# Patient Record
Sex: Male | Born: 1945 | Race: White | Hispanic: No | Marital: Married | Smoking: Never smoker
Health system: Southern US, Community
[De-identification: ages and names within clinical notes are randomized; demographics above are authoritative.]

## PROBLEM LIST (undated history)

## (undated) DIAGNOSIS — R251 Tremor, unspecified: Secondary | ICD-10-CM

## (undated) DIAGNOSIS — K219 Gastro-esophageal reflux disease without esophagitis: Secondary | ICD-10-CM

## (undated) DIAGNOSIS — M1990 Unspecified osteoarthritis, unspecified site: Secondary | ICD-10-CM

## (undated) DIAGNOSIS — G4730 Sleep apnea, unspecified: Secondary | ICD-10-CM

## (undated) DIAGNOSIS — R351 Nocturia: Secondary | ICD-10-CM

## (undated) DIAGNOSIS — M19011 Primary osteoarthritis, right shoulder: Secondary | ICD-10-CM

## (undated) DIAGNOSIS — Z87442 Personal history of urinary calculi: Secondary | ICD-10-CM

## (undated) HISTORY — PX: JOINT REPLACEMENT: SHX530

## (undated) HISTORY — PX: APPENDECTOMY: SHX54

## (undated) HISTORY — PX: TONSILLECTOMY: SUR1361

---

## 2010-04-10 ENCOUNTER — Encounter
Admission: RE | Admit: 2010-04-10 | Discharge: 2010-04-10 | Payer: Self-pay | Source: Home / Self Care | Attending: Neurosurgery | Admitting: Neurosurgery

## 2010-08-19 ENCOUNTER — Encounter (HOSPITAL_COMMUNITY)
Admission: RE | Admit: 2010-08-19 | Discharge: 2010-08-19 | Disposition: A | Discharge status: Home / Self Care | Payer: Medicare Other | Source: Ambulatory Visit | Attending: Orthopedic Surgery | Admitting: Orthopedic Surgery

## 2010-08-19 LAB — CBC
MCH: 32 pg (ref 26.0–34.0)
MCHC: 34.8 g/dL (ref 30.0–36.0)
Platelets: 183 10*3/uL (ref 150–400)

## 2010-08-19 LAB — URINALYSIS, ROUTINE W REFLEX MICROSCOPIC
Bilirubin Urine: NEGATIVE
Glucose, UA: NEGATIVE mg/dL
Hgb urine dipstick: NEGATIVE
Ketones, ur: NEGATIVE mg/dL
Protein, ur: NEGATIVE mg/dL

## 2010-08-19 LAB — DIFFERENTIAL
Basophils Relative: 0 % (ref 0–1)
Eosinophils Absolute: 0.2 10*3/uL (ref 0.0–0.7)
Monocytes Absolute: 0.8 10*3/uL (ref 0.1–1.0)
Monocytes Relative: 9 % (ref 3–12)
Neutrophils Relative %: 64 % (ref 43–77)

## 2010-08-19 LAB — SURGICAL PCR SCREEN: MRSA, PCR: NEGATIVE

## 2010-08-19 LAB — COMPREHENSIVE METABOLIC PANEL
AST: 73 U/L — ABNORMAL HIGH (ref 0–37)
BUN: 16 mg/dL (ref 6–23)
CO2: 32 mEq/L (ref 19–32)
Chloride: 99 mEq/L (ref 96–112)
Creatinine, Ser: 0.81 mg/dL (ref 0.4–1.5)
GFR calc non Af Amer: >60 mL/min (ref >60)
Total Bilirubin: 0.5 mg/dL (ref 0.3–1.2)

## 2010-08-19 LAB — PROTIME-INR: Prothrombin Time: 12.7 seconds (ref 11.6–15.2)

## 2010-08-20 LAB — URINE CULTURE: Culture: NO GROWTH

## 2010-08-27 ENCOUNTER — Inpatient Hospital Stay (HOSPITAL_COMMUNITY): Payer: Medicare Other

## 2010-08-27 ENCOUNTER — Inpatient Hospital Stay (HOSPITAL_COMMUNITY)
Admission: RE | Admit: 2010-08-27 | Discharge: 2010-08-29 | DRG: 470 | Disposition: A | Discharge status: Home Health | Payer: Medicare Other | Source: Ambulatory Visit | Attending: Orthopedic Surgery | Admitting: Orthopedic Surgery

## 2010-08-27 DIAGNOSIS — E669 Obesity, unspecified: Secondary | ICD-10-CM | POA: Diagnosis present

## 2010-08-27 DIAGNOSIS — Z7901 Long term (current) use of anticoagulants: Secondary | ICD-10-CM

## 2010-08-27 DIAGNOSIS — I10 Essential (primary) hypertension: Secondary | ICD-10-CM | POA: Diagnosis present

## 2010-08-27 DIAGNOSIS — K5900 Constipation, unspecified: Secondary | ICD-10-CM | POA: Diagnosis not present

## 2010-08-27 DIAGNOSIS — E7800 Pure hypercholesterolemia, unspecified: Secondary | ICD-10-CM | POA: Diagnosis present

## 2010-08-27 DIAGNOSIS — K219 Gastro-esophageal reflux disease without esophagitis: Secondary | ICD-10-CM | POA: Diagnosis present

## 2010-08-27 DIAGNOSIS — G4733 Obstructive sleep apnea (adult) (pediatric): Secondary | ICD-10-CM | POA: Diagnosis present

## 2010-08-27 DIAGNOSIS — E871 Hypo-osmolality and hyponatremia: Secondary | ICD-10-CM | POA: Diagnosis not present

## 2010-08-27 DIAGNOSIS — IMO0002 Reserved for concepts with insufficient information to code with codable children: Secondary | ICD-10-CM

## 2010-08-27 DIAGNOSIS — M169 Osteoarthritis of hip, unspecified: Principal | ICD-10-CM | POA: Diagnosis present

## 2010-08-27 DIAGNOSIS — M1610 Unilateral primary osteoarthritis, unspecified hip: Principal | ICD-10-CM | POA: Diagnosis present

## 2010-08-27 DIAGNOSIS — Z01812 Encounter for preprocedural laboratory examination: Secondary | ICD-10-CM

## 2010-08-27 DIAGNOSIS — D62 Acute posthemorrhagic anemia: Secondary | ICD-10-CM | POA: Diagnosis not present

## 2010-08-27 LAB — TYPE AND SCREEN: Antibody Screen: NEGATIVE

## 2010-08-28 LAB — CBC
MCH: 31.3 pg (ref 26.0–34.0)
Platelets: 126 10*3/uL — ABNORMAL LOW (ref 150–400)
RBC: 3.68 MIL/uL — ABNORMAL LOW (ref 4.22–5.81)
RDW: 12.4 % (ref 11.5–15.5)

## 2010-08-28 LAB — BASIC METABOLIC PANEL
Calcium: 7.9 mg/dL — ABNORMAL LOW (ref 8.4–10.5)
Chloride: 96 mEq/L (ref 96–112)
Creatinine, Ser: 0.84 mg/dL (ref 0.4–1.5)
GFR calc Af Amer: >60 mL/min (ref >60)
GFR calc non Af Amer: >60 mL/min (ref >60)

## 2010-08-29 LAB — BASIC METABOLIC PANEL
Chloride: 97 mEq/L (ref 96–112)
GFR calc Af Amer: >60 mL/min (ref >60)
GFR calc non Af Amer: >60 mL/min (ref >60)
Potassium: 4.1 mEq/L (ref 3.5–5.1)
Sodium: 132 mEq/L — ABNORMAL LOW (ref 135–145)

## 2010-08-29 LAB — CBC
Platelets: 141 10*3/uL — ABNORMAL LOW (ref 150–400)
RBC: 3.54 MIL/uL — ABNORMAL LOW (ref 4.22–5.81)
WBC: 10.6 10*3/uL — ABNORMAL HIGH (ref 4.0–10.5)

## 2010-09-15 NOTE — Op Note (Signed)
NAMESHADD, Juan Spencer               ACCOUNT NO.:  [id number]  MEDICAL RECORD NO.:  [id number]  LOCATION:  5030                         FACILITY:  [facility]  PHYSICIAN:  Molly Maduro A. Thurston Hole, M.D. DATE OF BIRTH:  October 07, 1945  DATE OF PROCEDURE:  08/27/2010 DATE OF DISCHARGE:                              OPERATIVE REPORT   PREOPERATIVE DIAGNOSIS:  Left hip degenerative joint disease.  POSTOPERATIVE DIAGNOSIS:  Left hip degenerative joint disease.  PROCEDURE:  Left total hip replacement using DePuy Press-Fit total hip system with acetabulum 58-mm Press-Fit pinnacle acetabulum with one locking screw and 10-degree polyethylene liner.  Femoral component #6 Summit Press-Fit femoral stem with +1 x 36 mm hip ball.  SURGEON:  Elana Alm. Thurston Hole, MD  ASSISTANT:  Julien Girt, PA-C  ANESTHESIA:  General.  OPERATIVE TIME:  1 hour 20 minutes.  ESTIMATED BLOOD LOSS:  250 mL.  COMPLICATIONS:  None.  DESCRIPTION OF PROCEDURE:  Juan Spencer was brought to the operating room on August 27, 2010, and placed on operative table in supine position. After being placed under general anesthesia, he received Ancef 2 g IV preoperatively for prophylaxis.  He had a Foley catheter placed under sterile conditions.  His left hip was examined under anesthesia.  He had flexion at 90, extension to 0, internal and external rotation of 20 degrees with leg lengths approximately equal.  He was then placed in the left lateral decubitus position and secured on the bed with a Stulberg frame.  His left hip and leg was prepped using sterile DuraPrep and draped using sterile technique.  Time-out procedure was called and the correct left hip identified.  Initially, through a 15-cm posterolateral greater trochanteric incision, an initial exposure was made.  The underlying subcutaneous tissues were incised along with skin incision. Iliotibial band and gluteus maximus fascia was incised longitudinally revealing the underlying  sciatic nerve which was carefully protected. The short external rotators of the hip and hip capsule were released off the femoral neck insertion intact and then the hip was then posteriorly dislocated.  He was found to have grade 3 and 4 DJD noted on the femoral head.  A femoral neck cut was then made 1.5 to 2 cm above the lesser trochanter in the appropriate manner of anteversion and inclination. Acetabulum was then exposed.  He was found to have grade 3 and 4 DJD in the acetabulum.  Degenerative acetabular labrum was removed.  Carefully placed retractors were then placed around the acetabulum and then sequential acetabular reaming was carried out in the appropriate manner of anteversion, abduction and inclination up to a 57-mm size and then a 58-mm acetabular trial was hammered into position with an excellent fit, it was then removed and the actual 58-mm pinnacle cup was hammered into position in the appropriate manner of anteversion, abduction and inclination with an excellent fit.  It was further secured in place with 120-degree locking screw in the 12 o'clock position.  A 10-degree polyethylene liner was then placed with a 10-degree lip in the posterolateral position.  At this point, the proximal femur was exposed. Sequential femoral canal reaming was carried out to a #6 size and broaching to a #6 size with  a #6 broach in place and a +1 x 36 mm hip ball.  The hip was reduced, taken through a full range of motion, found to be stable up to 80 degrees of internal rotation in both neutral and 30 degrees of adduction and also stable in abduction and external rotation.  Leg lengths were also found to be equal.  At this point, it was felt that the femoral trial was excellent size and fit.  It was then removed.  The femoral canal was irrigated and the actual #6 Press-Fit Summit stem was hammered into position with an excellent fit and the +1 x 36 mm hip ball was placed on the femoral neck and  tapped into position with an excellent Morse taper fit.  The hip was then reduced, again found to be stable throughout a full range of motion and leg lengths equal.  At this point, it was felt that all the components were of excellent size, fit and stability.  The wound was further irrigated with saline and then the short external rotators of the hip and hip capsule were reattached to their femoral neck insertion through two drill holes in the greater trochanter.  The iliotibial band and gluteus maximus fascia was closed with #1 Ethibond suture over two medium Hemovac drains.  Subcutaneous tissues were closed with 0 and 2-0 Vicryl, subcuticular layer closed with 4-0 Monocryl.  Sterile dressings were applied and an abduction pillow.  The patient turned supine, checked for leg lengths that were equal, rotation equal, pulses 2+ and symmetric. He was then awakened, extubated and taken to recovery room in stable condition.  Needle, sponge counts correct x2 at the end of the case.     Keri Tavella A. Thurston Hole, M.D.     RAW/MEDQ  D:  08/27/2010  T:  08/28/2010  Job:  [id number]  Electronically Signed by Salvatore Marvel M.D. on 09/15/2010 01:43:31 PM

## 2010-11-12 ENCOUNTER — Other Ambulatory Visit: Payer: Self-pay | Admitting: Neurosurgery

## 2010-11-12 DIAGNOSIS — M542 Cervicalgia: Secondary | ICD-10-CM

## 2010-11-12 DIAGNOSIS — M47812 Spondylosis without myelopathy or radiculopathy, cervical region: Secondary | ICD-10-CM

## 2010-11-15 ENCOUNTER — Ambulatory Visit
Admission: RE | Admit: 2010-11-15 | Discharge: 2010-11-15 | Disposition: A | Discharge status: Home / Self Care | Payer: Medicare Other | Source: Ambulatory Visit | Attending: Neurosurgery | Admitting: Neurosurgery

## 2010-11-15 DIAGNOSIS — M542 Cervicalgia: Secondary | ICD-10-CM

## 2010-11-15 DIAGNOSIS — M47812 Spondylosis without myelopathy or radiculopathy, cervical region: Secondary | ICD-10-CM

## 2012-11-17 ENCOUNTER — Other Ambulatory Visit (HOSPITAL_COMMUNITY): Payer: Self-pay | Admitting: Orthopedic Surgery

## 2012-11-17 DIAGNOSIS — M8700 Idiopathic aseptic necrosis of unspecified bone: Secondary | ICD-10-CM

## 2012-11-17 DIAGNOSIS — M25551 Pain in right hip: Secondary | ICD-10-CM

## 2012-11-22 ENCOUNTER — Encounter (HOSPITAL_COMMUNITY): Payer: Self-pay

## 2012-11-22 ENCOUNTER — Ambulatory Visit (HOSPITAL_COMMUNITY)
Admission: RE | Admit: 2012-11-22 | Discharge: 2012-11-22 | Disposition: A | Discharge status: Home / Self Care | Payer: Medicare Other | Source: Ambulatory Visit | Attending: Orthopedic Surgery | Admitting: Orthopedic Surgery

## 2012-11-22 DIAGNOSIS — M169 Osteoarthritis of hip, unspecified: Secondary | ICD-10-CM | POA: Insufficient documentation

## 2012-11-22 DIAGNOSIS — M25551 Pain in right hip: Secondary | ICD-10-CM

## 2012-11-22 DIAGNOSIS — M8700 Idiopathic aseptic necrosis of unspecified bone: Secondary | ICD-10-CM

## 2012-11-22 DIAGNOSIS — M25559 Pain in unspecified hip: Secondary | ICD-10-CM | POA: Insufficient documentation

## 2012-11-22 DIAGNOSIS — M1610 Unilateral primary osteoarthritis, unspecified hip: Secondary | ICD-10-CM | POA: Insufficient documentation

## 2013-10-10 ENCOUNTER — Ambulatory Visit
Admission: RE | Admit: 2013-10-10 | Discharge: 2013-10-10 | Disposition: A | Discharge status: Home / Self Care | Payer: Medicare Other | Source: Ambulatory Visit | Attending: Orthopedic Surgery | Admitting: Orthopedic Surgery

## 2013-10-10 ENCOUNTER — Other Ambulatory Visit: Payer: Medicare Other

## 2013-10-10 ENCOUNTER — Other Ambulatory Visit: Payer: Self-pay | Admitting: Orthopedic Surgery

## 2013-10-10 DIAGNOSIS — M19019 Primary osteoarthritis, unspecified shoulder: Secondary | ICD-10-CM

## 2013-11-16 ENCOUNTER — Other Ambulatory Visit: Payer: Self-pay | Admitting: Orthopedic Surgery

## 2013-11-29 ENCOUNTER — Encounter (HOSPITAL_COMMUNITY): Payer: Self-pay | Admitting: Pharmacy Technician

## 2013-11-30 ENCOUNTER — Encounter (HOSPITAL_COMMUNITY): Payer: Self-pay

## 2013-11-30 ENCOUNTER — Encounter (HOSPITAL_COMMUNITY)
Admission: RE | Admit: 2013-11-30 | Discharge: 2013-11-30 | Disposition: A | Discharge status: Home / Self Care | Payer: Medicare Other | Source: Ambulatory Visit | Attending: Orthopedic Surgery | Admitting: Orthopedic Surgery

## 2013-11-30 DIAGNOSIS — M19019 Primary osteoarthritis, unspecified shoulder: Secondary | ICD-10-CM | POA: Diagnosis present

## 2013-11-30 DIAGNOSIS — Z01812 Encounter for preprocedural laboratory examination: Secondary | ICD-10-CM | POA: Insufficient documentation

## 2013-11-30 HISTORY — DX: Tremor, unspecified: R25.1

## 2013-11-30 HISTORY — DX: Sleep apnea, unspecified: G47.30

## 2013-11-30 HISTORY — DX: Gastro-esophageal reflux disease without esophagitis: K21.9

## 2013-11-30 HISTORY — DX: Unspecified osteoarthritis, unspecified site: M19.90

## 2013-11-30 HISTORY — DX: Nocturia: R35.1

## 2013-11-30 LAB — BASIC METABOLIC PANEL WITH GFR
Anion gap: 14 (ref 5–15)
BUN: 21 mg/dL (ref 6–23)
CO2: 23 meq/L (ref 19–32)
Calcium: 9.2 mg/dL (ref 8.4–10.5)
Chloride: 104 meq/L (ref 96–112)
Creatinine, Ser: 0.79 mg/dL (ref 0.50–1.35)
GFR calc Af Amer: >90 mL/min
GFR calc non Af Amer: >90 mL/min
Glucose, Bld: 101 mg/dL — ABNORMAL HIGH (ref 70–99)
Potassium: 5.3 meq/L (ref 3.7–5.3)
Sodium: 141 meq/L (ref 137–147)

## 2013-11-30 LAB — CBC
HCT: 43 % (ref 39.0–52.0)
Hemoglobin: 14.7 g/dL (ref 13.0–17.0)
MCH: 32.2 pg (ref 26.0–34.0)
MCHC: 34.2 g/dL (ref 30.0–36.0)
MCV: 94.1 fL (ref 78.0–100.0)
Platelets: 110 K/uL — ABNORMAL LOW (ref 150–400)
RBC: 4.57 MIL/uL (ref 4.22–5.81)
RDW: 13.1 % (ref 11.5–15.5)
WBC: 5.8 K/uL (ref 4.0–10.5)

## 2013-11-30 LAB — TYPE AND SCREEN
ABO/RH(D): B POS
Antibody Screen: NEGATIVE

## 2013-11-30 NOTE — Pre-Procedure Instructions (Signed)
Juan Spencer  11/30/2013   Your procedure is scheduled on:  12-12-13    Tuesday   Report to [facility] Admitting at 5:30  AM.   Call this number if you have problems the morning of surgery: [phone]   Remember:   Do not eat food or drink liquids after midnight.    Take these medicines the morning of surgery with A SIP OF WATER: pain medication if needed,omeprazole(prilosec),primidone(Mysoline),propranolol(Inderal)   Do not wear jewelry  Do not wear lotions, powders, or perfumes. You may not wear deodorant.   Do not shave 48 hours prior to surgery. Men may shave face and neck.  Do not bring valuables to the hospital.  [facility] is not responsible for any belongings or valuables.               Contacts, dentures or bridgework may not be worn into surgery .  Leave suitcase in the car. After surgery it may be brought to your room.   For patients admitted to the hospital, discharge time is determined by your treatment team.               Patients discharged the day of surgery will not be allowed to drive home.      Special Instructions: See attached Sheet for instructions on CHG shower/bath     Please read over the following fact sheets that you were given: Pain Booklet, Coughing and Deep Breathing, Blood Transfusion Information and Surgical Site Infection Prevention

## 2013-12-04 NOTE — Progress Notes (Signed)
Attempted to call multi times and office message says to call back, that all lines are busy.

## 2013-12-04 NOTE — Progress Notes (Signed)
Spoke with the office staff, they did receive the request for last office visit and EKG, and will fax over later.

## 2013-12-07 NOTE — Progress Notes (Signed)
Spoke with office staff related to need for EKG , states that they will fax it right over.

## 2013-12-11 MED ORDER — CEFAZOLIN SODIUM-DEXTROSE 2-3 GM-% IV SOLR
2.0000 g | INTRAVENOUS | Status: AC
  Administered 2013-12-12: 2 g via INTRAVENOUS
  Filled 2013-12-11: qty 50

## 2013-12-12 ENCOUNTER — Encounter (HOSPITAL_COMMUNITY): Payer: Medicare Other | Admitting: Anesthesiology

## 2013-12-12 ENCOUNTER — Inpatient Hospital Stay (HOSPITAL_COMMUNITY)
Admission: RE | Admit: 2013-12-12 | Discharge: 2013-12-13 | DRG: 483 | Disposition: A | Discharge status: Home Health | Payer: Medicare Other | Source: Ambulatory Visit | Attending: Orthopedic Surgery | Admitting: Orthopedic Surgery

## 2013-12-12 ENCOUNTER — Encounter (HOSPITAL_COMMUNITY)
Admission: RE | Disposition: A | Discharge status: Home Health | Payer: Self-pay | Source: Ambulatory Visit | Attending: Orthopedic Surgery

## 2013-12-12 ENCOUNTER — Encounter (HOSPITAL_COMMUNITY): Payer: Self-pay

## 2013-12-12 ENCOUNTER — Inpatient Hospital Stay (HOSPITAL_COMMUNITY): Payer: Medicare Other | Admitting: Anesthesiology

## 2013-12-12 ENCOUNTER — Inpatient Hospital Stay (HOSPITAL_COMMUNITY): Payer: Medicare Other

## 2013-12-12 DIAGNOSIS — M19019 Primary osteoarthritis, unspecified shoulder: Principal | ICD-10-CM | POA: Diagnosis present

## 2013-12-12 DIAGNOSIS — K219 Gastro-esophageal reflux disease without esophagitis: Secondary | ICD-10-CM | POA: Diagnosis present

## 2013-12-12 DIAGNOSIS — G4730 Sleep apnea, unspecified: Secondary | ICD-10-CM | POA: Diagnosis present

## 2013-12-12 DIAGNOSIS — IMO0002 Reserved for concepts with insufficient information to code with codable children: Secondary | ICD-10-CM

## 2013-12-12 DIAGNOSIS — Z886 Allergy status to analgesic agent status: Secondary | ICD-10-CM

## 2013-12-12 DIAGNOSIS — G709 Myoneural disorder, unspecified: Secondary | ICD-10-CM | POA: Diagnosis present

## 2013-12-12 DIAGNOSIS — M19011 Primary osteoarthritis, right shoulder: Secondary | ICD-10-CM

## 2013-12-12 DIAGNOSIS — Z79899 Other long term (current) drug therapy: Secondary | ICD-10-CM | POA: Diagnosis not present

## 2013-12-12 HISTORY — PX: TOTAL SHOULDER ARTHROPLASTY: SHX126

## 2013-12-12 HISTORY — DX: Primary osteoarthritis, right shoulder: M19.011

## 2013-12-12 SURGERY — ARTHROPLASTY, SHOULDER, TOTAL
Anesthesia: General | Site: Shoulder | Laterality: Right

## 2013-12-12 MED ORDER — PANTOPRAZOLE SODIUM 40 MG PO TBEC
40.0000 mg | DELAYED_RELEASE_TABLET | Freq: Every day | ORAL | Status: DC
  Administered 2013-12-13: 40 mg via ORAL
  Filled 2013-12-12: qty 1

## 2013-12-12 MED ORDER — HYDROMORPHONE HCL 1 MG/ML IJ SOLN: 0.5000 mg | INTRAMUSCULAR | Status: DC | PRN

## 2013-12-12 MED ORDER — PROPOFOL 10 MG/ML IV BOLUS
INTRAVENOUS | Status: DC | PRN
  Administered 2013-12-12: 150 mg via INTRAVENOUS

## 2013-12-12 MED ORDER — BUPIVACAINE HCL (PF) 0.25 % IJ SOLN
INTRAMUSCULAR | Status: DC | PRN
  Administered 2013-12-12: 10 mL

## 2013-12-12 MED ORDER — ONDANSETRON HCL 4 MG/2ML IJ SOLN
INTRAMUSCULAR | Status: AC
  Filled 2013-12-12: qty 2

## 2013-12-12 MED ORDER — METOCLOPRAMIDE HCL 5 MG PO TABS
5.0000 mg | ORAL_TABLET | Freq: Three times a day (TID) | ORAL | Status: DC | PRN
  Filled 2013-12-12: qty 2

## 2013-12-12 MED ORDER — MENTHOL 3 MG MT LOZG: 1.0000 | LOZENGE | OROMUCOSAL | Status: DC | PRN

## 2013-12-12 MED ORDER — ACETAMINOPHEN 325 MG PO TABS: 650.0000 mg | ORAL_TABLET | Freq: Four times a day (QID) | ORAL | Status: DC | PRN

## 2013-12-12 MED ORDER — ARTIFICIAL TEARS OP OINT
TOPICAL_OINTMENT | OPHTHALMIC | Status: DC | PRN
  Administered 2013-12-12: 1 via OPHTHALMIC

## 2013-12-12 MED ORDER — FENTANYL CITRATE 0.05 MG/ML IJ SOLN
25.0000 ug | INTRAMUSCULAR | Status: DC | PRN
  Administered 2013-12-12: 50 ug via INTRAVENOUS

## 2013-12-12 MED ORDER — GLYCOPYRROLATE 0.2 MG/ML IJ SOLN
INTRAMUSCULAR | Status: DC | PRN
  Administered 2013-12-12: 0.3 mg via INTRAVENOUS
  Administered 2013-12-12: 0.4 mg via INTRAVENOUS
  Administered 2013-12-12: 0.2 mg via INTRAVENOUS

## 2013-12-12 MED ORDER — ONDANSETRON HCL 4 MG/2ML IJ SOLN
4.0000 mg | Freq: Four times a day (QID) | INTRAMUSCULAR | Status: DC | PRN
Start: 2013-12-12 — End: 2013-12-13

## 2013-12-12 MED ORDER — ARTIFICIAL TEARS OP OINT
TOPICAL_OINTMENT | OPHTHALMIC | Status: AC
Start: 2013-12-12 — End: 2013-12-12
  Filled 2013-12-12: qty 3.5

## 2013-12-12 MED ORDER — SENNA 8.6 MG PO TABS
1.0000 | ORAL_TABLET | Freq: Two times a day (BID) | ORAL | Status: DC
  Administered 2013-12-12 – 2013-12-13 (×3): 8.6 mg via ORAL
  Filled 2013-12-12 (×4): qty 1

## 2013-12-12 MED ORDER — NEOSTIGMINE METHYLSULFATE 10 MG/10ML IV SOLN
INTRAVENOUS | Status: DC | PRN
  Administered 2013-12-12: 2 mg via INTRAVENOUS

## 2013-12-12 MED ORDER — ONDANSETRON HCL 4 MG PO TABS: 4.0000 mg | ORAL_TABLET | Freq: Four times a day (QID) | ORAL | Status: DC | PRN

## 2013-12-12 MED ORDER — EPHEDRINE SULFATE 50 MG/ML IJ SOLN
INTRAMUSCULAR | Status: DC | PRN
  Administered 2013-12-12: 15 mg via INTRAVENOUS

## 2013-12-12 MED ORDER — ACETAMINOPHEN 650 MG RE SUPP: 650.0000 mg | Freq: Four times a day (QID) | RECTAL | Status: DC | PRN

## 2013-12-12 MED ORDER — PRIMIDONE 50 MG PO TABS
50.0000 mg | ORAL_TABLET | Freq: Two times a day (BID) | ORAL | Status: DC
  Administered 2013-12-12 – 2013-12-13 (×2): 50 mg via ORAL
  Filled 2013-12-12 (×4): qty 1

## 2013-12-12 MED ORDER — SODIUM CHLORIDE 0.9 % IR SOLN
Status: DC | PRN
  Administered 2013-12-12: 3000 mL

## 2013-12-12 MED ORDER — POLYETHYLENE GLYCOL 3350 17 G PO PACK: 17.0000 g | PACK | Freq: Every day | ORAL | Status: DC | PRN

## 2013-12-12 MED ORDER — MAGNESIUM CITRATE PO SOLN: 1.0000 | Freq: Once | ORAL | Status: AC | PRN

## 2013-12-12 MED ORDER — OXYCODONE HCL 5 MG PO TABS
5.0000 mg | ORAL_TABLET | ORAL | Status: DC | PRN
  Administered 2013-12-12: 5 mg via ORAL
  Administered 2013-12-12: 10 mg via ORAL
  Administered 2013-12-12: 5 mg via ORAL
  Administered 2013-12-13 (×4): 10 mg via ORAL
  Filled 2013-12-12 (×4): qty 2
  Filled 2013-12-12: qty 1
  Filled 2013-12-12: qty 2
  Filled 2013-12-12: qty 1

## 2013-12-12 MED ORDER — CEFAZOLIN SODIUM-DEXTROSE 2-3 GM-% IV SOLR
2.0000 g | Freq: Four times a day (QID) | INTRAVENOUS | Status: AC
  Administered 2013-12-12 – 2013-12-13 (×3): 2 g via INTRAVENOUS
  Filled 2013-12-12 (×3): qty 50

## 2013-12-12 MED ORDER — PROPOFOL 10 MG/ML IV BOLUS
INTRAVENOUS | Status: AC
  Filled 2013-12-12: qty 20

## 2013-12-12 MED ORDER — POTASSIUM CHLORIDE IN NACL 20-0.45 MEQ/L-% IV SOLN
INTRAVENOUS | Status: DC
  Administered 2013-12-12: 20:00:00 via INTRAVENOUS
  Filled 2013-12-12 (×3): qty 1000

## 2013-12-12 MED ORDER — PREDNISONE 5 MG PO TABS
5.0000 mg | ORAL_TABLET | Freq: Every day | ORAL | Status: DC
Start: 2013-12-13 — End: 2013-12-13
  Administered 2013-12-13: 5 mg via ORAL
  Filled 2013-12-12 (×2): qty 1

## 2013-12-12 MED ORDER — LIDOCAINE HCL (CARDIAC) 20 MG/ML IV SOLN
INTRAVENOUS | Status: DC | PRN
  Administered 2013-12-12: 70 mg via INTRAVENOUS

## 2013-12-12 MED ORDER — MIDAZOLAM HCL 2 MG/2ML IJ SOLN
INTRAMUSCULAR | Status: AC
  Filled 2013-12-12: qty 2

## 2013-12-12 MED ORDER — DIPHENHYDRAMINE HCL 12.5 MG/5ML PO ELIX: 12.5000 mg | ORAL_SOLUTION | ORAL | Status: DC | PRN

## 2013-12-12 MED ORDER — OXYCODONE-ACETAMINOPHEN 5-325 MG PO TABS
1.0000 | ORAL_TABLET | ORAL | Status: DC | PRN
  Administered 2013-12-12 – 2013-12-13 (×4): 2 via ORAL
  Filled 2013-12-12 (×4): qty 2

## 2013-12-12 MED ORDER — METHOCARBAMOL 500 MG PO TABS
500.0000 mg | ORAL_TABLET | Freq: Four times a day (QID) | ORAL | Status: DC | PRN
  Administered 2013-12-12 – 2013-12-13 (×5): 500 mg via ORAL
  Filled 2013-12-12 (×4): qty 1

## 2013-12-12 MED ORDER — FENTANYL CITRATE 0.05 MG/ML IJ SOLN
INTRAMUSCULAR | Status: DC | PRN
  Administered 2013-12-12 (×2): 50 ug via INTRAVENOUS

## 2013-12-12 MED ORDER — PROPRANOLOL HCL ER 120 MG PO CP24
120.0000 mg | ORAL_CAPSULE | Freq: Every day | ORAL | Status: DC
  Administered 2013-12-13: 120 mg via ORAL
  Filled 2013-12-12 (×2): qty 1

## 2013-12-12 MED ORDER — TRAZODONE HCL 50 MG PO TABS
50.0000 mg | ORAL_TABLET | Freq: Every day | ORAL | Status: DC
  Administered 2013-12-12: 50 mg via ORAL
  Filled 2013-12-12 (×2): qty 1

## 2013-12-12 MED ORDER — ONDANSETRON HCL 4 MG/2ML IJ SOLN
INTRAMUSCULAR | Status: DC | PRN
  Administered 2013-12-12: 4 mg via INTRAVENOUS

## 2013-12-12 MED ORDER — BISACODYL 10 MG RE SUPP: 10.0000 mg | Freq: Every day | RECTAL | Status: DC | PRN

## 2013-12-12 MED ORDER — SIMVASTATIN 20 MG PO TABS
20.0000 mg | ORAL_TABLET | Freq: Every day | ORAL | Status: DC
  Administered 2013-12-13: 20 mg via ORAL
  Filled 2013-12-12 (×2): qty 1

## 2013-12-12 MED ORDER — SENNA-DOCUSATE SODIUM 8.6-50 MG PO TABS: 2.0000 | ORAL_TABLET | Freq: Every day | ORAL | Status: DC

## 2013-12-12 MED ORDER — FENTANYL CITRATE 0.05 MG/ML IJ SOLN
INTRAMUSCULAR | Status: AC
  Administered 2013-12-12: 13:00:00
  Filled 2013-12-12: qty 2

## 2013-12-12 MED ORDER — LIDOCAINE HCL (CARDIAC) 20 MG/ML IV SOLN
INTRAVENOUS | Status: AC
  Filled 2013-12-12: qty 5

## 2013-12-12 MED ORDER — ROPIVACAINE HCL 5 MG/ML IJ SOLN
INTRAMUSCULAR | Status: DC | PRN
  Administered 2013-12-12: 25 mL via PERINEURAL

## 2013-12-12 MED ORDER — ROCURONIUM BROMIDE 50 MG/5ML IV SOLN
INTRAVENOUS | Status: AC
  Filled 2013-12-12: qty 1

## 2013-12-12 MED ORDER — BACLOFEN 10 MG PO TABS: 10.0000 mg | ORAL_TABLET | Freq: Three times a day (TID) | ORAL | Status: DC

## 2013-12-12 MED ORDER — DOCUSATE SODIUM 100 MG PO CAPS
100.0000 mg | ORAL_CAPSULE | Freq: Two times a day (BID) | ORAL | Status: DC
  Administered 2013-12-12 – 2013-12-13 (×2): 100 mg via ORAL
  Filled 2013-12-12 (×3): qty 1

## 2013-12-12 MED ORDER — ASPIRIN-ACETAMINOPHEN-CAFFEINE 250-250-65 MG PO TABS
2.0000 | ORAL_TABLET | Freq: Four times a day (QID) | ORAL | Status: DC | PRN
  Filled 2013-12-12: qty 2

## 2013-12-12 MED ORDER — PHENOL 1.4 % MT LIQD: 1.0000 | OROMUCOSAL | Status: DC | PRN

## 2013-12-12 MED ORDER — BUPIVACAINE HCL (PF) 0.25 % IJ SOLN
INTRAMUSCULAR | Status: AC
  Filled 2013-12-12: qty 30

## 2013-12-12 MED ORDER — FENTANYL CITRATE 0.05 MG/ML IJ SOLN
INTRAMUSCULAR | Status: AC
  Filled 2013-12-12: qty 5

## 2013-12-12 MED ORDER — ROCURONIUM BROMIDE 100 MG/10ML IV SOLN
INTRAVENOUS | Status: DC | PRN
  Administered 2013-12-12: 50 mg via INTRAVENOUS

## 2013-12-12 MED ORDER — PHENYLEPHRINE HCL 10 MG/ML IJ SOLN
10.0000 mg | INTRAVENOUS | Status: DC | PRN
  Administered 2013-12-12: 10 ug/min via INTRAVENOUS

## 2013-12-12 MED ORDER — ALUM & MAG HYDROXIDE-SIMETH 200-200-20 MG/5ML PO SUSP: 30.0000 mL | ORAL | Status: DC | PRN

## 2013-12-12 MED ORDER — ADULT MULTIVITAMIN W/MINERALS CH
1.0000 | ORAL_TABLET | Freq: Two times a day (BID) | ORAL | Status: DC
  Administered 2013-12-12 – 2013-12-13 (×2): 1 via ORAL
  Filled 2013-12-12 (×4): qty 1

## 2013-12-12 MED ORDER — CLONAZEPAM 0.25 MG PO TBDP
0.2500 mg | ORAL_TABLET | Freq: Every day | ORAL | Status: DC
Start: 2013-12-12 — End: 2013-12-12

## 2013-12-12 MED ORDER — OXYCODONE HCL 5 MG PO TABS
ORAL_TABLET | ORAL | Status: AC
  Filled 2013-12-12: qty 1

## 2013-12-12 MED ORDER — PROMETHAZINE HCL 25 MG/ML IJ SOLN: 6.2500 mg | INTRAMUSCULAR | Status: DC | PRN

## 2013-12-12 MED ORDER — ONDANSETRON HCL 4 MG PO TABS: 4.0000 mg | ORAL_TABLET | Freq: Three times a day (TID) | ORAL | Status: DC | PRN

## 2013-12-12 MED ORDER — 0.9 % SODIUM CHLORIDE (POUR BTL) OPTIME
TOPICAL | Status: DC | PRN
  Administered 2013-12-12: 1000 mL

## 2013-12-12 MED ORDER — LIDOCAINE-EPINEPHRINE (PF) 1.5 %-1:200000 IJ SOLN
INTRAMUSCULAR | Status: DC | PRN
  Administered 2013-12-12: 10 mL via PERINEURAL

## 2013-12-12 MED ORDER — CLONAZEPAM 0.5 MG PO TABS
0.2500 mg | ORAL_TABLET | Freq: Every day | ORAL | Status: DC
  Administered 2013-12-13: 0.25 mg via ORAL
  Filled 2013-12-12 (×2): qty 1

## 2013-12-12 MED ORDER — METOCLOPRAMIDE HCL 5 MG/ML IJ SOLN: 5.0000 mg | Freq: Three times a day (TID) | INTRAMUSCULAR | Status: DC | PRN

## 2013-12-12 MED ORDER — DEXTROSE 5 % IV SOLN
500.0000 mg | Freq: Four times a day (QID) | INTRAVENOUS | Status: DC | PRN
  Filled 2013-12-12: qty 5

## 2013-12-12 MED ORDER — METHOCARBAMOL 500 MG PO TABS
ORAL_TABLET | ORAL | Status: AC
  Administered 2013-12-12: 13:00:00
  Filled 2013-12-12: qty 1

## 2013-12-12 MED ORDER — MIDAZOLAM HCL 5 MG/5ML IJ SOLN
INTRAMUSCULAR | Status: DC | PRN
  Administered 2013-12-12 (×2): 1 mg via INTRAVENOUS

## 2013-12-12 MED ORDER — OXYCODONE-ACETAMINOPHEN 10-325 MG PO TABS: 1.0000 | ORAL_TABLET | Freq: Four times a day (QID) | ORAL | Status: DC | PRN

## 2013-12-12 MED ORDER — LACTATED RINGERS IV SOLN
INTRAVENOUS | Status: DC | PRN
  Administered 2013-12-12 (×2): via INTRAVENOUS

## 2013-12-12 MED ORDER — PROPRANOLOL HCL 60 MG PO TABS
60.0000 mg | ORAL_TABLET | Freq: Every day | ORAL | Status: DC
  Administered 2013-12-12: 60 mg via ORAL
  Filled 2013-12-12 (×2): qty 1

## 2013-12-12 SURGICAL SUPPLY — 74 items
BENZOIN TINCTURE PRP APPL 2/3 (GAUZE/BANDAGES/DRESSINGS) ×2 IMPLANT
BIT DRILL QUICK RELEASE PRPHRL (DRILL) ×3 IMPLANT
BLADE SAW SGTL MED 73X18.5 STR (BLADE) ×2 IMPLANT
BOOTCOVER CLEANROOM LRG (PROTECTIVE WEAR) IMPLANT
BOWL SMART MIX CTS (DISPOSABLE) IMPLANT
BRUSH FEMORAL CANAL (MISCELLANEOUS) IMPLANT
CAP TOTAL SHOULDER ×2 IMPLANT
CEMENT BONE DEPUY (Cement) ×2 IMPLANT
CLSR STERI-STRIP ANTIMIC 1/2X4 (GAUZE/BANDAGES/DRESSINGS) ×2 IMPLANT
COVER SURGICAL LIGHT HANDLE (MISCELLANEOUS) ×2 IMPLANT
COVER TABLE BACK 60X90 (DRAPES) ×2 IMPLANT
DRAPE C-ARM 42X72 X-RAY (DRAPES) IMPLANT
DRAPE INCISE IOBAN 66X45 STRL (DRAPES) IMPLANT
DRAPE U-SHAPE 47X51 STRL (DRAPES) ×2 IMPLANT
DRILL QUICK RELEASE PERIPHERAL (DRILL) ×6
DRSG AQUACEL AG ADV 3.5X10 (GAUZE/BANDAGES/DRESSINGS) ×2 IMPLANT
DRSG MEPILEX BORDER 4X8 (GAUZE/BANDAGES/DRESSINGS) IMPLANT
DRSG PAD ABDOMINAL 8X10 ST (GAUZE/BANDAGES/DRESSINGS) IMPLANT
DURAPREP 26ML APPLICATOR (WOUND CARE) ×2 IMPLANT
ELECT BLADE 6.5 EXT (BLADE) IMPLANT
ELECT NEEDLE TIP 2.8 STRL (NEEDLE) IMPLANT
ELECT REM PT RETURN 9FT ADLT (ELECTROSURGICAL) ×2
ELECTRODE REM PT RTRN 9FT ADLT (ELECTROSURGICAL) ×1 IMPLANT
EVACUATOR 1/8 PVC DRAIN (DRAIN) IMPLANT
FACESHIELD WRAPAROUND (MASK) IMPLANT
GAUZE SPONGE 4X4 12PLY STRL (GAUZE/BANDAGES/DRESSINGS) IMPLANT
GLOVE BIO SURGEON STRL SZ7 (GLOVE) ×2 IMPLANT
GLOVE BIO SURGEON STRL SZ8 (GLOVE) ×2 IMPLANT
GLOVE BIOGEL PI IND STRL 6.5 (GLOVE) ×2 IMPLANT
GLOVE BIOGEL PI IND STRL 8 (GLOVE) ×1 IMPLANT
GLOVE BIOGEL PI INDICATOR 6.5 (GLOVE) ×2
GLOVE BIOGEL PI INDICATOR 8 (GLOVE) ×1
GLOVE BIOGEL PI ORTHO PRO SZ8 (GLOVE) ×2
GLOVE ORTHO TXT STRL SZ7.5 (GLOVE) ×2 IMPLANT
GLOVE PI ORTHO PRO STRL SZ8 (GLOVE) ×2 IMPLANT
GLOVE SURG ORTHO 8.0 STRL STRW (GLOVE) ×4 IMPLANT
GOWN BRE IMP PREV XXLGXLNG (GOWN DISPOSABLE) ×2 IMPLANT
GOWN STRL REUS W/ TWL LRG LVL3 (GOWN DISPOSABLE) ×1 IMPLANT
GOWN STRL REUS W/ TWL XL LVL3 (GOWN DISPOSABLE) ×2 IMPLANT
GOWN STRL REUS W/TWL LRG LVL3 (GOWN DISPOSABLE) ×1
GOWN STRL REUS W/TWL XL LVL3 (GOWN DISPOSABLE) ×2
HANDPIECE INTERPULSE COAX TIP (DISPOSABLE) ×1
HOOD PEEL AWAY FACE SHEILD DIS (HOOD) ×6 IMPLANT
KIT BASIN OR (CUSTOM PROCEDURE TRAY) ×2 IMPLANT
KIT ROOM TURNOVER OR (KITS) ×2 IMPLANT
MANIFOLD NEPTUNE II (INSTRUMENTS) ×2 IMPLANT
NEEDLE 1/2 CIR CATGUT .05X1.09 (NEEDLE) ×2 IMPLANT
NEEDLE HYPO 25GX1X1/2 BEV (NEEDLE) ×2 IMPLANT
NS IRRIG 1000ML POUR BTL (IV SOLUTION) ×2 IMPLANT
PACK SHOULDER (CUSTOM PROCEDURE TRAY) ×2 IMPLANT
PAD ARMBOARD 7.5X6 YLW CONV (MISCELLANEOUS) ×4 IMPLANT
PIN HUMERAL STMN 3.2MMX9IN (INSTRUMENTS) ×2 IMPLANT
PIN STEINMANN THREADED TIP (PIN) ×2 IMPLANT
SET HNDPC FAN SPRY TIP SCT (DISPOSABLE) ×1 IMPLANT
SLING ARM IMMOBILIZER LRG (SOFTGOODS) IMPLANT
SLING ARM IMMOBILIZER MED (SOFTGOODS) ×2 IMPLANT
SMARTMIX MINI TOWER (MISCELLANEOUS) ×2
SPONGE LAP 18X18 X RAY DECT (DISPOSABLE) ×2 IMPLANT
SUCTION FRAZIER TIP 10 FR DISP (SUCTIONS) ×2 IMPLANT
SUPPORT WRAP ARM LG (MISCELLANEOUS) ×2 IMPLANT
SUT FIBERWIRE #2 38 REV NDL BL (SUTURE) ×6
SUT MNCRL AB 4-0 PS2 18 (SUTURE) IMPLANT
SUT VIC AB 0 CT1 27 (SUTURE) ×1
SUT VIC AB 0 CT1 27XBRD ANBCTR (SUTURE) ×1 IMPLANT
SUT VIC AB 2-0 CT1 27 (SUTURE) ×1
SUT VIC AB 2-0 CT1 TAPERPNT 27 (SUTURE) ×1 IMPLANT
SUT VIC AB 3-0 SH 8-18 (SUTURE) ×2 IMPLANT
SUTURE FIBERWR#2 38 REV NDL BL (SUTURE) ×3 IMPLANT
SYR CONTROL 10ML LL (SYRINGE) ×2 IMPLANT
TOWEL OR 17X24 6PK STRL BLUE (TOWEL DISPOSABLE) ×2 IMPLANT
TOWEL OR 17X26 10 PK STRL BLUE (TOWEL DISPOSABLE) ×2 IMPLANT
TOWER SMARTMIX MINI (MISCELLANEOUS) ×1 IMPLANT
TRAY FOLEY CATH 16FRSI W/METER (SET/KITS/TRAYS/PACK) IMPLANT
WATER STERILE IRR 1000ML POUR (IV SOLUTION) IMPLANT

## 2013-12-12 NOTE — Anesthesia Procedure Notes (Addendum)
Procedure Name: Intubation Date/Time: 12/12/2013 7:34 AM Performed by: De Nurse Pre-anesthesia Checklist: Patient identified, Emergency Drugs available, Suction available and Patient being monitored Patient Re-evaluated:Patient Re-evaluated prior to inductionOxygen Delivery Method: Circle system utilized Preoxygenation: Pre-oxygenation with 100% oxygen Intubation Type: IV induction Ventilation: Mask ventilation without difficulty Laryngoscope Size: Mac and 3 Grade View: Grade I Tube type: Oral Tube size: 7.5 mm Number of attempts: 1 Airway Equipment and Method: Stylet Placement Confirmation: ETT inserted through vocal cords under direct vision,  positive ETCO2 and breath sounds checked- equal and bilateral Secured at: 22 cm Tube secured with: Tape Dental Injury: Teeth and Oropharynx as per pre-operative assessment    Anesthesia Regional Block:  Interscalene brachial plexus block  Pre-Anesthetic Checklist: ,, timeout performed, Correct Patient, Correct Site, Correct Laterality, Correct Procedure, Correct Position, site marked, Risks and benefits discussed,  Surgical consent,  Pre-op evaluation,  At surgeon's request and post-op pain management  Laterality: Right  Prep: chloraprep       Needles:  Injection technique: Single-shot  Needle Type: Echogenic Stimulator Needle     Needle Length: 9cm 9 cm Needle Gauge: 21 and 21 G    Additional Needles:  Procedures: ultrasound guided (picture in chart) Interscalene brachial plexus block Narrative:  Injection made incrementally with aspirations every 5 mL.  Performed by: Personally  Anesthesiologist: Eilene Ghazi MD  Additional Notes: Patient tolerated the procedure well without complications

## 2013-12-12 NOTE — H&P (Signed)
PREOPERATIVE H&P  Chief Complaint: djd right shoulder  HPI: Juan Spencer is a 68 y.o. male who presents for preoperative history and physical with a diagnosis of djd right shoulder. Symptoms are rated as moderate to severe, and have been worsening.  This is significantly impairing activities of daily living.  He has elected for surgical management. He has failed injections, activity modification, anti-inflammatories.   Past Medical History  Diagnosis Date  . Sleep apnea     uses cpap  . GERD (gastroesophageal reflux disease)   . Frequent urination at night   . Arthritis   . Tremors of nervous system   . Neuromuscular disorder     tremors- taking propranolol for tremors of hands    Past Surgical History  Procedure Laterality Date  . Joint replacement Left   . Tonsillectomy    . Appendectomy     History   Social History  . Marital Status: Married    Spouse Name: N/A    Number of Children: N/A  . Years of Education: N/A   Social History Main Topics  . Smoking status: Never Smoker   . Smokeless tobacco: Not on file  . Alcohol Use: No  . Drug Use: No  . Sexual Activity: Not on file   Other Topics Concern  . Not on file   Social History Narrative  . No narrative on file   No family history on file. Allergies  Allergen Reactions  . Aleve [Naproxen Sodium] Anaphylaxis, Hives and Swelling  . Motrin [Ibuprofen] Anaphylaxis, Hives and Swelling   Prior to Admission medications   Medication Sig Start Date End Date Taking? Authorizing Provider  aspirin-acetaminophen-caffeine (EXCEDRIN MIGRAINE) [phone] MG per tablet Take 2 tablets by mouth every 6 (six) hours as needed for headache.   Yes Historical Provider, MD  clonazePAM (KLONOPIN) 0.25 MG disintegrating tablet Take 0.25 mg by mouth at bedtime.   Yes Historical Provider, MD  docusate sodium (COLACE) 100 MG capsule Take 100 mg by mouth 2 (two) times daily.   Yes Historical Provider, MD  HYDROcodone-acetaminophen  (NORCO/VICODIN) 5-325 MG per tablet Take 1 tablet by mouth every 6 (six) hours as needed for moderate pain.   Yes Historical Provider, MD  Multiple Vitamin (MULTIVITAMIN WITH MINERALS) TABS tablet Take 1 tablet by mouth 2 (two) times daily.   Yes Historical Provider, MD  Omega-3 Fatty Acids (FISH OIL) 1000 MG CAPS Take 1,000 mg by mouth 2 (two) times daily.   Yes Historical Provider, MD  omeprazole (PRILOSEC) 20 MG capsule Take 20 mg by mouth daily.   Yes Historical Provider, MD  predniSONE (DELTASONE) 5 MG tablet Take 5 mg by mouth daily with breakfast.   Yes Historical Provider, MD  primidone (MYSOLINE) 50 MG tablet Take 50 mg by mouth 2 (two) times daily.   Yes Historical Provider, MD  propranolol (INDERAL) 60 MG tablet Take 60 mg by mouth at bedtime.    Yes Historical Provider, MD  propranolol ER (INDERAL LA) 120 MG 24 hr capsule Take 120 mg by mouth daily.   Yes Historical Provider, MD  simvastatin (ZOCOR) 20 MG tablet Take 20 mg by mouth daily.   Yes Historical Provider, MD  traZODone (DESYREL) 100 MG tablet Take 50 mg by mouth at bedtime.   Yes Historical Provider, MD  Vitamin Mixture (ESTER-C PO) Take 1,000 mg by mouth daily.   Yes Historical Provider, MD     Positive ROS: All other systems have been reviewed and were otherwise negative with the  exception of those mentioned in the HPI and as above.  Physical Exam: General: Alert, no acute distress Cardiovascular: No pedal edema Respiratory: No cyanosis, no use of accessory musculature GI: No organomegaly, abdomen is soft and non-tender Skin: No lesions in the area of chief complaint Neurologic: Sensation intact distally Psychiatric: Patient is competent for consent with normal mood and affect Lymphatic: No axillary or cervical lymphadenopathy  MUSCULOSKELETAL: right shoulder AROM 0-90 deg with crepitance and severe pain.  CT with end stage DJD and osteophyte formation with sclerosis and cyst formation  Assessment: djd right  shoulder  Plan: Plan for Procedure(s): RIGHT TOTAL SHOULDER ARTHROPLASTY  The risks benefits and alternatives were discussed with the patient including but not limited to the risks of nonoperative treatment, versus surgical intervention including infection, bleeding, nerve injury,  blood clots, cardiopulmonary complications, morbidity, mortality, among others, and they were willing to proceed.   Eulas Post, MD Cell [phone]   12/12/2013 6:11 AM

## 2013-12-12 NOTE — Transfer of Care (Signed)
Immediate Anesthesia Transfer of Care Note  Patient: Juan Spencer  Procedure(s) Performed: Procedure(s): RIGHT TOTAL SHOULDER ARTHROPLASTY (Right)  Patient Location: PACU  Anesthesia Type:General  Level of Consciousness: awake, alert  and oriented  Airway & Oxygen Therapy: Patient Spontanous Breathing and Patient connected to nasal cannula oxygen  Post-op Assessment: Report given to PACU RN  Post vital signs: Reviewed and stable  Complications: No apparent anesthesia complications

## 2013-12-12 NOTE — Anesthesia Preprocedure Evaluation (Addendum)
Anesthesia Evaluation  Patient identified by MRN, date of birth, ID band Patient awake    Reviewed: Allergy & Precautions, H&P , NPO status , Patient's Chart, lab work & pertinent test results  Airway Mallampati: II TM Distance: <3 FB Neck ROM: Full    Dental no notable dental hx. (+) Partial Upper, Partial Lower, Teeth Intact, Dental Advisory Given   Pulmonary sleep apnea and Continuous Positive Airway Pressure Ventilation ,  breath sounds clear to auscultation  Pulmonary exam normal       Cardiovascular negative cardio ROS  Rhythm:Regular Rate:Bradycardia     Neuro/Psych  Neuromuscular disease negative neurological ROS  negative psych ROS   GI/Hepatic Neg liver ROS, GERD-  Medicated,  Endo/Other  negative endocrine ROS  Renal/GU negative Renal ROS  negative genitourinary   Musculoskeletal negative musculoskeletal ROS (+) Arthritis -,   Abdominal   Peds negative pediatric ROS (+)  Hematology negative hematology ROS (+)   Anesthesia Other Findings   Reproductive/Obstetrics negative OB ROS                          Anesthesia Physical Anesthesia Plan  ASA: II  Anesthesia Plan: General   Post-op Pain Management:    Induction: Intravenous  Airway Management Planned: Oral ETT  Additional Equipment:   Intra-op Plan:   Post-operative Plan: Extubation in OR  Informed Consent: I have reviewed the patients History and Physical, chart, labs and discussed the procedure including the risks, benefits and alternatives for the proposed anesthesia with the patient or authorized representative who has indicated his/her understanding and acceptance.   Dental advisory given  Plan Discussed with: CRNA and Surgeon  Anesthesia Plan Comments:         Anesthesia Quick Evaluation

## 2013-12-12 NOTE — Op Note (Signed)
12/12/2013  10:03 AM  PATIENT:  Juan Spencer    PRE-OPERATIVE DIAGNOSIS:  Right shoulder end-stage glenohumeral osteoarthritis  POST-OPERATIVE DIAGNOSIS:  Same  PROCEDURE:  RIGHT TOTAL SHOULDER ARTHROPLASTY  SURGEON:  Eulas Post, MD  PHYSICIAN ASSISTANT: Janace Litten, OPA-C, present and scrubbed throughout the case, critical for completion in a timely fashion, and for retraction, instrumentation, and closure.  Second assistant: Alfredo Martinez, PA Student  ANESTHESIA:   General  PREOPERATIVE INDICATIONS:  Juan Spencer is a  68 y.o. male with a diagnosis of djd right shoulder who failed conservative measures and elected for surgical management.    The risks benefits and alternatives were discussed with the patient preoperatively including but not limited to the risks of infection, bleeding, nerve injury, cardiopulmonary complications, the need for revision surgery, dislocation, loosening, incomplete relief of pain, among others, and the patient was willing to proceed.   OPERATIVE IMPLANTS: Biomet size 12 mini press-fit humeral stem, size 46+21 Versa-dial humeral head, set in the B position with increased coverage posteriorly, with a medium cemented glenoid polyethylene 3 peg implant with a central regenerex noncemented post.   OPERATIVE FINDINGS: Advanced glenohumeral osteoarthritis involving the glenoid and the humeral head with substantial osteophyte formation inferiorly.   OPERATIVE PROCEDURE: The patient was brought to the operating room and placed in the supine position. General anesthesia was administered. IV antibiotics were given.  The upper extremity was prepped and draped in usual sterile fashion. The patient was in a beachchair position with all bony prominences padded.   Time out was performed and a deltopectoral approach was carried out. The biceps tendon was tenodesed to the pectoralis tendon. The subscapularis was released, tagging it with a #2 MaxBraid, leaving a  cuff of tendon for repair.   The inferior osteophyte was removed, and release of the capsule off of the humeral side was completed. The head was dislocated, and I reamed sequentially. I placed the humeral cutting guide at 30 of retroversion, and then pinned this into place, and made my humeral neck cut. This was at the appropriate level.   I then placed deep retractors and exposed the glenoid. I excised the labrum circumferentially, taking care to protect the axillary nerve inferiorly.   I then placed a guidewire into the center position, controlling appropriate version and inclination. I then reamed over the guidewire with the small reamer, and was satisfied with the preparation. I preserved the subchondral bone in order to maximize the strength and minimize the risk for subsequent subsidence.   I then drilled the central hole for the regenerex peg, and then placed the guide, and then drilled the 3 peripheral peg holes. I had excellent bony circumferential contact.   I then cleaned the glenoid, irrigated it copiously, and then dried it and cemented the prosthesis into place. Excellent seating was achieved. I had full exposure. The cement cured, and then I turned my attention to the humeral side.   I sequentially broached, up to the selected size, with the broach set at 30 of retroversion. I then placed the real stem. I trialed with multiple heads, and the above-named component was selected. Increased posterior coverage improved the coverage. The soft tissue tension was appropriate.   I then impacted the real humeral head into place, reduced the head, and irrigated copiously. Excellent stability and range of motion was achieved. I repaired the subscapularis with 5 #2 Fiberwire as well as the rotator interval, and irrigated copiously once more. The subcutaneous tissue was closed with Vicryl including  the deltopectoral fascia.   The skin was closed with Steri-Strips and sterile gauze was applied. He  had a preoperative nerve block. He tolerated the procedure well and there were no complications.

## 2013-12-12 NOTE — Evaluation (Signed)
Occupational Therapy Evaluation Patient Details Name: Juan Spencer MRN: [id number] DOB: 1945/06/04 Today's Date: 12/12/2013    History of Present Illness 68 y.o. s/p right TSA   Clinical Impression   Pt s/p above. Pt independent with ADLs, PTA. Feel pt will benefit from acute OT to increase independence prior to d/c.    Follow Up Recommendations  No OT follow up;Supervision/Assistance - 24 hour    Equipment Recommendations  Other (comment) (tbd)    Recommendations for Other Services       Precautions / Restrictions Precautions Precautions: Shoulder Type of Shoulder Precautions: elbow, wrist, hand AROM, NO shoulder movement Shoulder Interventions: Shoulder sling/immobilizer;Off for dressing/bathing/exercises Precaution Booklet Issued: Yes (comment) Precaution Comments: Educated on precautions Required Braces or Orthoses: Sling Restrictions Weight Bearing Restrictions: Yes RUE Weight Bearing: Non weight bearing      Mobility Bed Mobility Overal bed mobility: Needs Assistance Bed Mobility: Supine to Sit;Sit to Supine     Supine to sit: Max assist Sit to supine: Min guard   General bed mobility comments: assist with hips and with trunk. Cues for technique. Assist to scoot HOB.  Transfers                 General transfer comment: did not transfer today-pt became nauseous and returning to supine.    Balance                                            ADL Overall ADL's : Needs assistance/impaired                                       General ADL Comments: Educated on how to perform ADLs while maintaining shoulder precautions. Performed elbow, wrist, hand exercises.     Vision                     Perception     Praxis      Pertinent Vitals/Pain Pain Assessment: 0-10 Pain Score: 7  Pain Location: RUE Pain Intervention(s): Repositioned;Other (comment) (notified nurse for meds)     Hand Dominance      Extremity/Trunk Assessment Upper Extremity Assessment Upper Extremity Assessment: RUE deficits/detail RUE Deficits / Details: Rt TSA RUE Sensation: decreased light touch   Lower Extremity Assessment Lower Extremity Assessment: Defer to PT evaluation       Communication Communication Communication: No difficulties   Cognition Arousal/Alertness: Lethargic Behavior During Therapy: WFL for tasks assessed/performed Overall Cognitive Status: Within Functional Limits for tasks assessed                     General Comments       Exercises Exercises: Other exercises;Shoulder Other Exercises Other Exercises: AAROM right elbow flexion/extension (10 reps) in supine; AROM wrist flexion/extension -15 reps sitting and 15 reps of AROM digit composite extension/flexion   Shoulder Instructions Shoulder Instructions Donning/doffing shirt without moving shoulder:  (educated) Method for sponge bathing under operated UE:  (educated; pt able to verbalize) Donning/doffing sling/immobilizer: Moderate assistance Correct positioning of sling/immobilizer:  (educated/demonstrated) ROM for elbow, wrist and digits of operated UE: Minimal assistance Sling wearing schedule (on at all times/off for ADL's):  (educated) Proper positioning of operated UE when showering:  (educated) Positioning of UE while sleeping: Patient able to independently direct caregiver;Caregiver  independent with task    Home Living Family/patient expects to be discharged to:: Private residence Living Arrangements: Spouse/significant other Available Help at Discharge: Family;Available 24 hours/day Type of Home: House Home Access: Level entry     Home Layout: One level (with basement)     Bathroom Shower/Tub: Arts development officer Toilet: Handicapped height     Home Equipment: Cane - single point;Shower seat - built in          Prior Functioning/Environment Level of Independence: Independent              OT Diagnosis: Acute pain   OT Problem List: Decreased strength;Decreased range of motion;Decreased activity tolerance;Decreased knowledge of use of DME or AE;Decreased knowledge of precautions;Pain;Impaired UE functional use;Impaired sensation   OT Treatment/Interventions: Self-care/ADL training;DME and/or AE instruction;Therapeutic activities;Patient/family education;Balance training;Therapeutic exercise    OT Goals(Current goals can be found in the care plan section) Acute Rehab OT Goals Patient Stated Goal: go home OT Goal Formulation: With patient Time For Goal Achievement: 12/19/13 Potential to Achieve Goals: Good  OT Frequency: Min 2X/week   Barriers to D/C:            Co-evaluation              End of Session Equipment Utilized During Treatment: Other (comment) (sling) Nurse Communication: Other (comment) (pain level; meds)  Activity Tolerance: Other (comment);Patient limited by pain (became nauseous) Patient left: in bed;with call bell/phone within reach;with family/visitor present   Time: 4098-1191 OT Time Calculation (min): 31 min Charges:  OT General Charges $OT Visit: 1 Procedure OT Evaluation $Initial OT Evaluation Tier I: 1 Procedure OT Treatments $Self Care/Home Management : 8-22 mins G-CodesEarlie Raveling OTR/L [phone] 12/12/2013, 5:52 PM

## 2013-12-12 NOTE — Progress Notes (Signed)
Utilization review completed.  

## 2013-12-12 NOTE — Anesthesia Postprocedure Evaluation (Signed)
  Anesthesia Post-op Note  Patient: Juan Spencer  Procedure(s) Performed: Procedure(s) (LRB): RIGHT TOTAL SHOULDER ARTHROPLASTY (Right)  Patient Location: PACU  Anesthesia Type: GA combined with regional for post-op pain  Level of Consciousness: awake and alert   Airway and Oxygen Therapy: Patient Spontanous Breathing  Post-op Pain: mild  Post-op Assessment: Post-op Vital signs reviewed, Patient's Cardiovascular Status Stable, Respiratory Function Stable, Patent Airway and No signs of Nausea or vomiting  Last Vitals:  Filed Vitals:   12/12/13 1045  BP: 108/60  Pulse: 47  Temp:   Resp: 20    Post-op Vital Signs: stable   Complications: No apparent anesthesia complications

## 2013-12-13 ENCOUNTER — Encounter (HOSPITAL_COMMUNITY): Payer: Self-pay | Admitting: Orthopedic Surgery

## 2013-12-13 DIAGNOSIS — M19019 Primary osteoarthritis, unspecified shoulder: Secondary | ICD-10-CM | POA: Diagnosis not present

## 2013-12-13 LAB — CBC
HEMATOCRIT: 37.7 % — AB (ref 39.0–52.0)
Hemoglobin: 13 g/dL (ref 13.0–17.0)
MCH: 32.2 pg (ref 26.0–34.0)
MCHC: 34.5 g/dL (ref 30.0–36.0)
MCV: 93.3 fL (ref 78.0–100.0)
PLATELETS: 105 10*3/uL — AB (ref 150–400)
RBC: 4.04 MIL/uL — AB (ref 4.22–5.81)
RDW: 13 % (ref 11.5–15.5)
WBC: 7 10*3/uL (ref 4.0–10.5)

## 2013-12-13 LAB — BASIC METABOLIC PANEL
ANION GAP: 12 (ref 5–15)
BUN: 10 mg/dL (ref 6–23)
CALCIUM: 8.5 mg/dL (ref 8.4–10.5)
CO2: 24 meq/L (ref 19–32)
CREATININE: 0.72 mg/dL (ref 0.50–1.35)
Chloride: 97 mEq/L (ref 96–112)
GFR calc Af Amer: >90 mL/min (ref >90)
GFR calc non Af Amer: >90 mL/min (ref >90)
GLUCOSE: 120 mg/dL — AB (ref 70–99)
Potassium: 3.8 mEq/L (ref 3.7–5.3)
Sodium: 133 mEq/L — ABNORMAL LOW (ref 137–147)

## 2013-12-13 MED ORDER — INFLUENZA VAC SPLIT QUAD 0.5 ML IM SUSY
0.5000 mL | PREFILLED_SYRINGE | Freq: Once | INTRAMUSCULAR | Status: AC
  Administered 2013-12-13: 0.5 mL via INTRAMUSCULAR
  Filled 2013-12-13: qty 0.5

## 2013-12-13 NOTE — Discharge Summary (Signed)
Physician Discharge Summary  Patient ID: Juan Spencer MRN: [id number] DOB/AGE: August 23, 1945 68 y.o.  Admit date: 12/12/2013 Discharge date: 12/13/2013  Admission Diagnoses:  Osteoarthritis of right shoulder  Discharge Diagnoses:  Principal Problem:   Osteoarthritis of right shoulder Active Problems:   Primary localized osteoarthrosis, shoulder region   Past Medical History  Diagnosis Date  . Sleep apnea     uses cpap  . GERD (gastroesophageal reflux disease)   . Frequent urination at night   . Arthritis   . Tremors of nervous system   . Neuromuscular disorder     tremors- taking propranolol for tremors of hands   . Osteoarthritis of right shoulder 12/12/2013    Surgeries: Procedure(s): RIGHT TOTAL SHOULDER ARTHROPLASTY on 12/12/2013   Consultants (if any):    Discharged Condition: Improved  Hospital Course: Jimmylee Ratterree is an 68 y.o. male who was admitted 12/12/2013 with a diagnosis of Osteoarthritis of right shoulder and went to the operating room on 12/12/2013 and underwent the above named procedures.    He was given perioperative antibiotics:  Anti-infectives   Start     Dose/Rate Route Frequency Ordered Stop   12/12/13 1315  ceFAZolin (ANCEF) IVPB 2 g/50 mL premix     2 g 100 mL/hr over 30 Minutes Intravenous Every 6 hours 12/12/13 1300 12/13/13 0312   12/12/13 0600  ceFAZolin (ANCEF) IVPB 2 g/50 mL premix     2 g 100 mL/hr over 30 Minutes Intravenous On call to O.R. 12/11/13 1428 12/12/13 0736    .  He was given sequential compression devices, early ambulation for DVT prophylaxis.  He benefited maximally from the hospital stay and there were no complications.    Recent vital signs:  Filed Vitals:   12/13/13 1001  BP: 116/60  Pulse: 61  Temp:   Resp:     Recent laboratory studies:  Lab Results  Component Value Date   HGB 13.0 12/13/2013   HGB 14.7 11/30/2013   HGB 11.1* 08/29/2010   Lab Results  Component Value Date   WBC 7.0 12/13/2013   PLT 105*  12/13/2013   Lab Results  Component Value Date   INR 0.93 08/19/2010   Lab Results  Component Value Date   NA 133* 12/13/2013   K 3.8 12/13/2013   CL 97 12/13/2013   CO2 24 12/13/2013   BUN 10 12/13/2013   CREATININE 0.72 12/13/2013   GLUCOSE 120* 12/13/2013    Discharge Medications:     Medication List    STOP taking these medications       HYDROcodone-acetaminophen 5-325 MG per tablet  Commonly known as:  NORCO/VICODIN      TAKE these medications       aspirin-acetaminophen-caffeine 250-250-65 MG per tablet  Commonly known as:  EXCEDRIN MIGRAINE  Take 2 tablets by mouth every 6 (six) hours as needed for headache.     baclofen 10 MG tablet  Commonly known as:  LIORESAL  Take 1 tablet (10 mg total) by mouth 3 (three) times daily. As needed for muscle spasm     clonazePAM 0.25 MG disintegrating tablet  Commonly known as:  KLONOPIN  Take 0.25 mg by mouth at bedtime.     docusate sodium 100 MG capsule  Commonly known as:  COLACE  Take 100 mg by mouth 2 (two) times daily.     ESTER-C PO  Take 1,000 mg by mouth daily.     Fish Oil 1000 MG Caps  Take 1,000 mg by mouth 2 (  two) times daily.     multivitamin with minerals Tabs tablet  Take 1 tablet by mouth 2 (two) times daily.     omeprazole 20 MG capsule  Commonly known as:  PRILOSEC  Take 20 mg by mouth daily.     ondansetron 4 MG tablet  Commonly known as:  ZOFRAN  Take 1 tablet (4 mg total) by mouth every 8 (eight) hours as needed for nausea or vomiting.     oxyCODONE-acetaminophen 10-325 MG per tablet  Commonly known as:  PERCOCET  Take 1-2 tablets by mouth every 6 (six) hours as needed for pain. MAXIMUM TOTAL ACETAMINOPHEN DOSE IS 4000 MG PER DAY     predniSONE 5 MG tablet  Commonly known as:  DELTASONE  Take 5 mg by mouth daily with breakfast.     primidone 50 MG tablet  Commonly known as:  MYSOLINE  Take 50 mg by mouth 2 (two) times daily.     propranolol ER 120 MG 24 hr capsule  Commonly known as:   INDERAL LA  Take 120 mg by mouth daily.     propranolol 60 MG tablet  Commonly known as:  INDERAL  Take 60 mg by mouth at bedtime.     sennosides-docusate sodium 8.6-50 MG tablet  Commonly known as:  SENOKOT-S  Take 2 tablets by mouth daily.     simvastatin 20 MG tablet  Commonly known as:  ZOCOR  Take 20 mg by mouth daily.     traZODone 100 MG tablet  Commonly known as:  DESYREL  Take 50 mg by mouth at bedtime.        Diagnostic Studies: Dg Shoulder Right Port  2014-01-10   CLINICAL DATA:  Postoperative exam after right total shoulder arthroplasty  EXAM: PORTABLE RIGHT SHOULDER - 2+ VIEW  COMPARISON:  Preoperative CT 10/10/2013. No dedicated plain radiographs.  FINDINGS: Expected appearance after right humeral prosthesis. No fracture line visualized. No evidence for hardware failure. Right lung is grossly clear. Right AC joint distance is upper limits of normal allowing for technique.  IMPRESSION: Expected postoperative appearance after right shoulder arthroplasty.   Electronically Signed   By: Christiana Pellant M.D.   On: January 10, 2014 13:55    Disposition: 06-Home-Health Care Svc       Signed: Montina Dorrance P 12/13/2013, 10:34 AM

## 2013-12-13 NOTE — Progress Notes (Signed)
Physical Therapy Note  Physical Therapy evaluation order acknowledged. Pt was d/c prior to evaluation attempt this afternoon. Pt seen by OT for evaluation yesterday 12/12/2013 but per note, pt was unable to get out of bed.   [address] [place], [place] [phone]

## 2014-03-23 HISTORY — PX: HIP SURGERY: SHX245

## 2015-03-24 HISTORY — PX: SHOULDER ARTHROSCOPY: SHX128

## 2016-03-23 HISTORY — PX: CARPAL TUNNEL RELEASE: SHX101

## 2018-04-12 ENCOUNTER — Encounter: Payer: Self-pay | Admitting: Neurology

## 2018-04-19 NOTE — Progress Notes (Signed)
Subjective:   Juan Spencer was seen in consultation in the movement disorder clinic at the request of Maeola Harman, MD.  This patient is accompanied in the office by his spouse who supplements the history. The evaluation is for tremor.  Tremor started approximately 45 years ago, but it has gotten worse over the years.  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  It involves the bilateral UE.  The R is worse than the R.  He is R hand dominant but about 8 months ago he hand to start eating with his L hand.  Tremor is most noticeable when using the hands, esp with fine motor coordination.   There is a family hx of tremor in his father, but nothing like his.  He sees Dr. Vernice Jefferson in [facility].    Affected by caffeine:  No. (drinks starbucks cold brew per day) Affected by alcohol:  Doesn't drink any EtOH Affected by stress:  No. Affected by fatigue:  No. Spills soup if on spoon:  Yes.   Spills glass of liquid if full:  Yes.   Affects ADL's (tying shoes, brushing teeth, etc):  No. (still able to shave with blade)  Current/Previously tried tremor medications: Primidone, 250 mg, 0.5/0.5/1 po q hs; propranolol LA 120 mg in the AM and a 60 mg at night ; clonazepam, 0.25 mg at bedtime to sleep;  - tried artane but given HA; tried cogentin but given headache.   Trying CBD oil - hasn't seen a lot of difference in tremor but helped pain in head/neck/shoulder  Current medications that may exacerbate tremor:  prednisone  Outside reports reviewed: historical medical records, office notes and referral letter/letters.   Voice: no change Sleep: uses cpap  - just recent trouble sleeping x 2 nights but usually no trouble Postural symptoms:  No.  Falls?  No. Bradykinesia symptoms: no bradykinesia noted Loss of smell:  Yes.  , just a little Loss of taste:  No. Urinary Incontinence:  No. Difficulty Swallowing:  No. Handwriting, micrographia: No. Trouble with ADL's:  No.  Trouble  buttoning clothing: No. Depression:  No. but wife states that he is short tempered Memory changes:  No. N/V:  No. Lightheaded:  No.  Syncope: No. Diplopia:  No. Dyskinesia:  No.   Allergies  Allergen Reactions  . Aleve [Naproxen Sodium] Anaphylaxis, Hives and Swelling  . Motrin [Ibuprofen] Anaphylaxis, Hives and Swelling    Outpatient Encounter Medications as of 04/21/2018  Medication Sig  . Aspirin-Acetaminophen-Caffeine (EXCEDRIN PO) Take by mouth as needed.  . clonazePAM (KLONOPIN) 0.25 MG disintegrating tablet Take 0.25 mg by mouth at bedtime.  . docusate sodium (COLACE) 100 MG capsule Take 100 mg by mouth 2 (two) times daily.  . Glucosamine-Chondroit-Vit C-Mn (GLUCOSAMINE 1500 COMPLEX PO) Take by mouth.  . Multiple Vitamin (MULTIVITAMIN WITH MINERALS) TABS tablet Take 1 tablet by mouth 2 (two) times daily.  . Omega-3 Fatty Acids (FISH OIL) 1000 MG CAPS Take 1,000 mg by mouth 2 (two) times daily.  Marland Kitchen omeprazole (PRILOSEC) 20 MG capsule Take 20 mg by mouth daily.  . predniSONE (DELTASONE) 5 MG tablet Take 5 mg by mouth daily with breakfast.  . primidone (MYSOLINE) 250 MG tablet 1/2 in the am, 1/2 at dinner, 1 at bedtime  . propranolol (INDERAL) 60 MG tablet Take 60 mg by mouth at bedtime.   . propranolol ER (INDERAL LA) 120 MG 24 hr capsule Take 120 mg by mouth daily.  . simvastatin (ZOCOR) 20 MG  tablet Take 20 mg by mouth daily.  . traMADol (ULTRAM) 50 MG tablet Take 50 mg by mouth as needed.  . traZODone (DESYREL) 100 MG tablet Take 50 mg by mouth at bedtime.  . Vitamin Mixture (ESTER-C PO) Take 1,000 mg by mouth daily.  . montelukast (SINGULAIR) 10 MG tablet Take 10 mg by mouth as needed.  . [DISCONTINUED] aspirin-acetaminophen-caffeine (EXCEDRIN MIGRAINE) 250-250-65 MG per tablet Take 2 tablets by mouth every 6 (six) hours as needed for headache.  . [DISCONTINUED] baclofen (LIORESAL) 10 MG tablet Take 1 tablet (10 mg total) by mouth 3 (three) times daily. As needed for muscle  spasm  . [DISCONTINUED] ondansetron (ZOFRAN) 4 MG tablet Take 1 tablet (4 mg total) by mouth every 8 (eight) hours as needed for nausea or vomiting.  . [DISCONTINUED] oxyCODONE-acetaminophen (PERCOCET) 10-325 MG per tablet Take 1-2 tablets by mouth every 6 (six) hours as needed for pain. MAXIMUM TOTAL ACETAMINOPHEN DOSE IS 4000 MG PER DAY  . [DISCONTINUED] primidone (MYSOLINE) 50 MG tablet Take 50 mg by mouth 2 (two) times daily.  . [DISCONTINUED] sennosides-docusate sodium (SENOKOT-S) 8.6-50 MG tablet Take 2 tablets by mouth daily.   No facility-administered encounter medications on file as of 04/21/2018.     Past Medical History:  Diagnosis Date  . Arthritis   . Frequent urination at night   . GERD (gastroesophageal reflux disease)   . 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 Sleep apnea    uses cpap  . Tremors of nervous system     Past Surgical History:  Procedure Laterality Date  . APPENDECTOMY    . CARPAL TUNNEL RELEASE Left 2018  . HIP SURGERY Left 2016  . JOINT REPLACEMENT Left   . SHOULDER ARTHROSCOPY Right 2017  . TONSILLECTOMY    . TOTAL SHOULDER ARTHROPLASTY Right 12/12/2013   Procedure: RIGHT TOTAL SHOULDER ARTHROPLASTY;  Surgeon: Eulas PostJoshua P Landau, MD;  Location: [facility] OR;  Service: Orthopedics;  Laterality: Right;    Social History   Socioeconomic History  . Marital status: Married    Spouse name: Not on file  . Number of children: Not on file  . Years of education: Not on file  . Highest education level: Not on file  Occupational History  . Not on file  Social Needs  . Financial resource strain: Not on file  . Food insecurity:    Worry: Not on file    Inability: Not on file  . Transportation needs:    Medical: Not on file    Non-medical: Not on file  Tobacco Use  . Smoking status: Never Smoker  . Smokeless tobacco: Never Used  Substance and Sexual Activity  . Alcohol use: No  . Drug use: No  . Sexual activity: Not on file  Lifestyle  . Physical  activity:    Days per week: Not on file    Minutes per session: Not on file  . Stress: Not on file  Relationships  . Social connections:    Talks on phone: Not on file    Gets together: Not on file    Attends religious service: Not on file    Active member of club or organization: Not on file    Attends meetings of clubs or organizations: Not on file    Relationship status: Not on file  . Intimate partner violence:    Fear of current or ex partner: Not on file    Emotionally abused: Not on file    Physically abused: Not  on file    Forced sexual activity: Not on file  Other Topics Concern  . Not on file  Social History Narrative  . Not on file    Family Status  Relation Name Status  . Mother  Deceased  . Father  Deceased  . Brother  Alive  . Daughter  Alive  . Son  Alive    Review of Systems Review of Systems  Constitutional: Negative.   HENT: Negative.   Eyes: Negative.   Cardiovascular: Negative.   Gastrointestinal: Negative.   Genitourinary: Negative.   Musculoskeletal: Negative.   Skin: Negative.   Endo/Heme/Allergies: Negative.      Objective:   VITALS:   Vitals:   04/21/18 0844  BP: 122/64  Pulse: (!) 52  SpO2: 98%  Weight: 179 lb (81.2 kg)  Height: 5\' 6"  (1.676 m)   Gen:  Appears stated age and in NAD. HEENT:  Normocephalic, atraumatic. The mucous membranes are moist. The superficial temporal arteries are without ropiness or tenderness. Cardiovascular: Bradycardic.  Regular. Lungs: Clear to auscultation bilaterally. Neck: There are no carotid bruits noted bilaterally.  NEUROLOGICAL:  Orientation:  The patient is alert and oriented x 3.  Recent and remote memory are intact.  Attention span and concentration are normal.  Able to name objects and repeat without trouble.  Fund of knowledge is appropriate Cranial nerves: There is good facial symmetry. There is pseudoptosis from lid lag.  The pupils are equal round and reactive to light bilaterally.  Fundoscopic exam reveals clear disc margins bilaterally. Extraocular muscles are intact and visual fields are full to confrontational testing. Speech is fluent and clear. Soft palate rises symmetrically and there is no tongue deviation. Hearing is intact to conversational tone. Tone: Tone is good throughout. Sensation: Sensation is intact to light touch and pinprick throughout (facial, trunk, extremities). Vibration is intact at the bilateral big toe. There is no extinction with double simultaneous stimulation. There is no sensory dermatomal level identified. Coordination:  The patient has no dysdiadichokinesia or dysmetria. Motor: Strength is 5/5 in the bilateral upper and lower extremities.  Shoulder shrug is equal bilaterally.  There is no pronator drift.  There are no fasciculations noted. DTR's: Deep tendon reflexes are -2/4 at the bilateral biceps, triceps, brachioradialis, patella and achilles.  Plantar responses are downgoing bilaterally. Gait and Station: The patient is able to ambulate without difficulty. The patient is able to heel toe walk without any difficulty. The patient is able to ambulate in a tandem fashion. The patient is able to stand in the Romberg position.   MOVEMENT EXAM: Tremor:  There is rare, intermittent rest tremor when distracted on the right.  There is mild postural tremor on the right that becomes at least moderate when he is given a weight.  There is no postural or intention tremor on the left.  He has significant trouble with Archimedes spirals on the right, and can barely get the pen on the paper.  Tremor is evident with Archimedes spirals on the left, but he really has no trouble with that.  He has very little trouble with pouring water from one glass to another, but that is primarily because he is stabilizing the right arm.     Assessment/Plan:   1.  Essential Tremor.  -His is asymmetric, but that can happen in 30% of the cases.  We discussed nature and  pathophysiology.  We discussed that this can continue to gradually get worse with time.  We discussed that some medications  can worsen this, as can caffeine use.  We discussed medication therapy as well as surgical therapy.  The patient has tried all the top line medications for tremor.  He has tried several second line medications.  I really do not think that further medications are going to help him significantly, without giving him more side effects.  He was shown HIPAA compliant videos of patients who have had DBS surgery.  We talked extensively about DBS surgery, including risks and benefits.  I talked to the patient about the logistics associated with DBS therapy.  We talked about risks which included but were not limited to infection, paralysis, intraoperative seizure, death, stroke, bleeding around the electrode.   I talked to patient about fiducial placement 1 week prior to DBS therapy.  I talked to the patient about what to expect in the operating room, including the fact that this is an awake surgery.  We talked about battery placement as well as which is done under general anesthesia, generally approximately one week following the initial surgery.  We also talked about the fact that the patient will need to be off of medications for surgery.  The patient and family were given the opportunity to ask questions, which they did, and I answered them to the best of my ability today.  He wants to think about it, but also asked me to set him up for neurocognitive testing.  We will do that.  Much greater than 50% of this visit was spent in counseling and coordinating care.  Total face to face time:  55 min   CC:  Pomposini, Rande Brunt, MD

## 2018-04-21 ENCOUNTER — Ambulatory Visit (INDEPENDENT_AMBULATORY_CARE_PROVIDER_SITE_OTHER): Payer: Medicare Other | Admitting: Neurology

## 2018-04-21 ENCOUNTER — Encounter: Payer: Self-pay | Admitting: Neurology

## 2018-04-21 VITALS — BP 122/64 | HR 52 | Ht 66.0 in | Wt 179.0 lb

## 2018-04-21 DIAGNOSIS — G250 Essential tremor: Secondary | ICD-10-CM | POA: Diagnosis not present

## 2018-04-21 NOTE — Patient Instructions (Signed)
1. You have been referred to [facility] for Neuro Psych testing. They will call you directly to schedule an appointment. Please call [phone] if you do not hear from them.

## 2018-04-22 ENCOUNTER — Telehealth: Payer: Self-pay | Admitting: Neurology

## 2018-04-22 NOTE — Telephone Encounter (Signed)
I spoke with him for over an hour last visit about the procedure.  What type of information does he need?  We can get him some online websites.  I told him the DBS manufacturers/reps could meet with him and so could patients who have had the procedure.

## 2018-04-22 NOTE — Telephone Encounter (Signed)
Patient is calling in about a procedure that was talked about in last visit with Dr.Tat and Dr.Stern. he would like some more info on that. Please call him back at [phone]. Thanks!

## 2018-04-22 NOTE — Telephone Encounter (Signed)
Spoke with pt letting him know that I am not familiar with DBS Sx.  Advised that Lesly Rubenstein, CMA is out of the office until Tuesday.  Pt states that he has 2 Dr appt's Tuesday morning and will be home after 1PM.

## 2018-04-26 ENCOUNTER — Telehealth: Payer: Self-pay | Admitting: Neurology

## 2018-04-26 NOTE — Telephone Encounter (Signed)
Received note from [facility] stating patient scheduled with Dr. Julieta Gutting as follows:  Diagnostic Clinical Interview: 05/12/2018 at 11am Neuropsychological testing: 05/12/2018 at 1pm Feedback results: 05/26/2018 at 11am

## 2018-05-30 ENCOUNTER — Telehealth: Payer: Self-pay | Admitting: Neurology

## 2018-05-30 NOTE — Telephone Encounter (Signed)
Left message on machine for patient to call back.

## 2018-05-30 NOTE — Telephone Encounter (Signed)
Received results from patient's neurocognitive testing, which was dated May 12, 2018 and he had feedback on May 26, 2018.  This was done in [place] by Dr. Julieta Gutting.  There was no evidence of any type of cognitive disorder.  Jade, please call the patient and find out how he would like to proceed.  He was not sure that he really wanted to do DBS, but wanted to get the neurocognitive testing out of the way.  If he wants to proceed further, then the next step would be to do an appointment with me for preop video, questions regarding surgery/devices/manufactures and a preoperative MRI following that.  It is okay to put him in a 60-minute slot if he would like to proceed.

## 2018-06-07 NOTE — Telephone Encounter (Signed)
Spoke with patient. Appt made for follow up to discuss DBS. He is seeing Dr. Venetia Maxon tomorrow and will also speak with him.

## 2018-06-22 ENCOUNTER — Ambulatory Visit: Payer: Medicare Other | Admitting: Neurology

## 2018-07-04 ENCOUNTER — Telehealth: Payer: Self-pay | Admitting: Neurology

## 2018-07-04 NOTE — Telephone Encounter (Signed)
Patient left a message on VM on 07-01-18 ( we were closed ) about needing a refill he did not leave the name of the medication

## 2018-07-04 NOTE — Telephone Encounter (Signed)
Patient called the office needing to have a refill on his Clonazepam 025 mg. He uses [facility] on [address] in [place]. Thanks

## 2018-07-04 NOTE — Telephone Encounter (Signed)
Left message giving patient information and instructed him to call his PCP.

## 2018-07-04 NOTE — Telephone Encounter (Signed)
Will you please send in refill

## 2018-07-04 NOTE — Telephone Encounter (Signed)
I have never RX this for him.  Per my notes, his prior neurologist was giving him this for sleep (not tremor).  I only see him for tremor.  Will need to get from PCP

## 2018-07-26 ENCOUNTER — Telehealth: Payer: Self-pay | Admitting: Neurology

## 2018-07-26 NOTE — Telephone Encounter (Signed)
-----   Message from Octaviano Batty Tat, DO sent at 07/25/2018  1:42 PM EDT ----- Will you call Dr. Rush Farmer office and get notes on this patient for me.  I referred to him

## 2018-07-26 NOTE — Telephone Encounter (Signed)
Requested pt notes to Dr. Maeola Harman fax [phone]

## 2018-08-01 NOTE — Progress Notes (Signed)
Virtual Visit via Video Note The purpose of this virtual visit is to provide medical care while limiting exposure to the novel coronavirus.    Consent was obtained for video visit:  Yes.   Answered questions that patient had about telehealth interaction:  Yes.   I discussed the limitations, risks, security and privacy concerns of performing an evaluation and management service by telemedicine. I also discussed with the patient that there may be a patient responsible charge related to this service. The patient expressed understanding and agreed to proceed.  Pt location: Home Physician Location: office Name of referring provider:  Pomposini, Rande Brunt, MD I connected with Juan Spencer at patients initiation/request on 08/02/2018 at  1:30 PM EDT by video enabled telemedicine application and verified that I am speaking with the correct person using two identifiers. Pt MRN:  [id number] Pt DOB:  [birth year] Video Participants:  Juan Spencer;  wife   History of Present Illness:  Patient is seen today in follow-up for essential tremor.  Patient had neurocognitive testing with Dr. Julieta Gutting in [place] on May 12, 2018 with feedback on May 26, 2018.  There was no evidence of any type of cognitive disorder.  Patient did see Dr. Venetia Maxon on June 08, 2018 and I reviewed those notes and there was a little discussion about DBS, but most of the discussion was regarding his back.  The patient was told to follow-up again if he decided to pursue DBS.  Pt states that he hasn't made up his mind at this point.  He does still tell me that he is on primidone 250 mg - 1/2 tablet in the AM and afternoon and 1 tablet at night and an additional 50 mg at dinnertime.  He takes propranolol LA, both a 60 mg q hs and a 120 mg in AM.  He states that his BP has been good.  Previously, he reports he was on higher dosages and his blood pressure did go down too low.  he is on klonopin and reports that he is using that for tremor  and not for sleep (records indicate that was for sleep) but he is taking it at bedtime.   He reports that he had to go back to his previous neurologist to get that filled when we denied the request is currently taking an oral dissolvable tablet, but that is costing him about $25 per month.  The propranolol is also quite expensive for him.   Observations/Objective:   Vitals:   08/02/18 1336  Weight: 170 lb (77.1 kg)  Height: 5\' 6"  (1.676 m)   GEN:  The patient appears stated age and is in NAD.  Neurological examination:  Orientation: The patient is alert and oriented x3. Cranial nerves: There is good facial symmetry. There is no facial hypomimia.  The speech is fluent and clear. Soft palate rises symmetrically and there is no tongue deviation. Hearing is intact to conversational tone. Motor: Strength is at least antigravity x 4.   Shoulder shrug is equal and symmetric.  There is no pronator drift.  Movement examination: Tone: unable Abnormal movements: Patient has mild postural tremor on the right that is much worse when he picks up a fairly large mug and then tremor becomes moderate to severe.   Coordination:  There is no decremation with RAM's, with hand opening and closing on the right.     Assessment and Plan:   1.  Essential tremor  -Patient has asymmetric essential tremor.  Patient has been on  primidone, propranolol, clonazepam, trihexyphenidyl, Cogentin, all without complete success.  Patient is still on primidone, 250 mg, half tablet in the morning, half a tablet in the afternoon, 50 mg at dinnertime, and another 250 mg at bedtime.  He is also on propranolol LA, 120 mg in the morning and 60 mg at night.  He is on clonazepam 0.25 mg daily.  This was just refilled by his neurologist in [place].  I did tell him I would take over the writing over these prescriptions, but given that he is on a controlled substance, I expressed that I could be the only writer of this medication.  I also  told him I would recommend that we switch him from the ODT version of clonazepam to regular clonazepam, 0.5 mg, and have him split that in half for cost purposes and he was agreeable to that.  He will let me know when he runs out of his current prescriptions.  -I talked to the patient about the logistics associated with DBS therapy.  I talked to the patient about risks/benefits/side effects of DBS therapy.  We talked about risks which included but were not limited to infection, paralysis, intraoperative seizure, death, stroke, bleeding around the electrode.   I talked to patient about fiducial placement 1 week prior to DBS therapy.  I talked to the patient about what to expect in the operating room, including the fact that this is an awake surgery.  We talked about battery placement as well as which is done under general anesthesia, generally approximately one week following the initial surgery.    The patient and family were given the opportunity to ask questions, which they did, and I answered them to the best of my ability today.  Patient had many questions and I answered them to the best of my ability.  If he decides to have surgery, he really would only want left VIM done since his right hand is really primarily the only thing affected.  However, he needs time to think about it, which I think is very reasonable.  I told him if he would I would like to talk to somebody that has had the surgery to let us know and we will try to hook him up with that resource.  Otherwise, we will plan to see him back in 6 months Follow Up Instructions:  6 months  -I discussed the assessment and treatment plan with the patient. The patient was provided an opportunity to ask questions and all were answered. The patient agreed with the plan and demonstrated an understanding of the instructions.   The patient was advised to call back or seek an in-person evaluation if the symptoms worsen or if the condition fails to improve as  anticipated.    Total Time spent in visit with the patient was:  30 minutes, of which more than 50% of the time was spent in counseling and/or coordinating care on safety associated with DBS, as above.   Pt understands and agrees with the plan of care outlined.     Kerin Salenebecca Marcella Charlson, DO

## 2018-08-02 ENCOUNTER — Telehealth (INDEPENDENT_AMBULATORY_CARE_PROVIDER_SITE_OTHER): Payer: Medicare Other | Admitting: Neurology

## 2018-08-02 ENCOUNTER — Other Ambulatory Visit: Payer: Self-pay

## 2018-08-02 ENCOUNTER — Encounter: Payer: Self-pay | Admitting: *Deleted

## 2018-08-02 ENCOUNTER — Encounter: Payer: Self-pay | Admitting: Neurology

## 2018-08-02 DIAGNOSIS — G250 Essential tremor: Secondary | ICD-10-CM

## 2018-08-08 ENCOUNTER — Ambulatory Visit: Payer: Medicare Other | Admitting: Neurology

## 2019-02-01 NOTE — Progress Notes (Deleted)
Juan Spencer was seen today in follow up for Essential tremor.  Pt is currently on primidone 250 mg, half tablet in the morning and half a tablet in the afternoon and 1 tablet at bedtime.  This is in addition to an additional 50 mg at dinnertime.  He is also on propranolol LA, 120 mg in the morning and 60 mg at night and clonazepam 0.25 mg daily.  He has previously been worked up for candidacy for DBS therapy.  He had neurocognitive testing in February and there were no cognitive deficits.  He saw Dr. Venetia MaxonStern in March, but that was primarily for back issues.  Pt denies falls.  Pt denies lightheadedness, near syncope.  No hallucinations.  Mood has been good.   PREVIOUS MEDICATIONS: {Parkinson's RX:18200}primidone; propranolol; artane; cogentin  ALLERGIES:   Allergies  Allergen Reactions  . Aleve [Naproxen Sodium] Anaphylaxis, Hives and Swelling  . Motrin [Ibuprofen] Anaphylaxis, Hives and Swelling    CURRENT MEDICATIONS:  Outpatient Encounter Medications as of 02/02/2019  Medication Sig  . Aspirin-Acetaminophen-Caffeine (EXCEDRIN PO) Take by mouth as needed.  . clonazePAM (KLONOPIN) 0.25 MG disintegrating tablet Take 0.25 mg by mouth at bedtime.  . docusate sodium (COLACE) 100 MG capsule Take 100 mg by mouth 2 (two) times daily.  . Glucosamine-Chondroit-Vit C-Mn (GLUCOSAMINE 1500 COMPLEX PO) Take 2 tablets by mouth daily.   Marland Kitchen. loratadine (CLARITIN) 10 MG tablet Take 10 mg by mouth daily.  . Multiple Vitamin (MULTIVITAMIN WITH MINERALS) TABS tablet Take 1 tablet by mouth 2 (two) times daily.  . Omega-3 Fatty Acids (FISH OIL) 1000 MG CAPS Take 1,000 mg by mouth 2 (two) times daily.  Marland Kitchen. omeprazole (PRILOSEC) 20 MG capsule Take 20 mg by mouth daily.  . predniSONE (DELTASONE) 5 MG tablet Take 5 mg by mouth daily with breakfast.  . primidone (MYSOLINE) 250 MG tablet 1/2 in the am, 1/2 at dinner, 1 at bedtime  . primidone (MYSOLINE) 50 MG tablet Take 50 mg by mouth every evening.  . propranolol  (INDERAL) 60 MG tablet Take 60 mg by mouth at bedtime.   . propranolol ER (INDERAL LA) 120 MG 24 hr capsule Take 120 mg by mouth daily.  . simvastatin (ZOCOR) 20 MG tablet Take 20 mg by mouth daily.  . traMADol (ULTRAM) 50 MG tablet Take 50 mg by mouth as needed.  . traZODone (DESYREL) 100 MG tablet Take 50 mg by mouth at bedtime.  . Vitamin Mixture (ESTER-C PO) Take 1,000 mg by mouth daily.   No facility-administered encounter medications on file as of 02/02/2019.     PAST MEDICAL HISTORY:   Past Medical History:  Diagnosis Date  . Arthritis   . Frequent urination at night   . GERD (gastroesophageal reflux disease)   . 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 Sleep apnea    uses cpap  . Tremors of nervous system     PAST SURGICAL HISTORY:   Past Surgical History:  Procedure Laterality Date  . APPENDECTOMY    . CARPAL TUNNEL RELEASE Left 2018  . HIP SURGERY Left 2016  . JOINT REPLACEMENT Left   . SHOULDER ARTHROSCOPY Right 2017  . TONSILLECTOMY    . 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 Marital status: Married    Spouse name:  Not on file  . Number of children: Not on file  . Years of education: Not on file  . Highest education level: Not on file  Occupational History  . Not on file  Social Needs  . Financial resource strain: Not on file  . Food insecurity    Worry: Not on file    Inability: Not on file  . Transportation needs    Medical: Not on file    Non-medical: Not on file  Tobacco Use  . Smoking status: Never Smoker  . Smokeless tobacco: Never Used  Substance and Sexual Activity  . Alcohol use: No  . Drug use: No  . Sexual activity: Not on file  Lifestyle  . Physical activity    Days per week: Not on file    Minutes per session: Not on  file  . Stress: Not on file  Relationships  . Social Musician on phone: Not on file    Gets together: Not on file    Attends religious service: Not on file    Active member of club or organization: Not on file    Attends meetings of clubs or organizations: Not on file    Relationship status: Not on file  . Intimate partner violence    Fear of current or ex partner: Not on file    Emotionally abused: Not on file    Physically abused: Not on file    Forced sexual activity: Not on file  Other Topics Concern  . Not on file  Social History Narrative  . Not on file    FAMILY HISTORY:   Family Status  Relation Name Status  . Mother  Deceased  . Father  Deceased  . Brother  Alive  . Daughter  Alive  . Son  Alive    ROS:  ROS  PHYSICAL EXAMINATION:    VITALS:  There were no vitals filed for this visit.  GEN:  The patient appears stated age and is in NAD. HEENT:  Normocephalic, atraumatic.  The mucous membranes are moist. The superficial temporal arteries are without ropiness or tenderness. CV:  RRR Lungs:  CTAB Neck/HEME:  There are no carotid bruits bilaterally.  Neurological examination:  Orientation: The patient is alert and oriented x3. Cranial nerves: There is good facial symmetry ***with facial hypomimia. The speech is fluent and clear. Soft palate rises symmetrically and there is no tongue deviation. Hearing is intact to conversational tone. Sensation: Sensation is intact to light touch throughout Motor: Strength is at least antigravity x4.  Movement examination: Tone: There is normal tone in the UE/LE Abnormal movements: *** Coordination:  There is no decremation with RAM's. Gait and Station: The patient has *** difficulty arising out of a deep-seated chair without the use of the hands. The patient's stride length is ***.  The patient has a *** pull test.      ASSESSMENT/PLAN:  ***1.  Essential tremor             -Patient has asymmetric essential  tremor.  Patient has been on primidone, propranolol, clonazepam, trihexyphenidyl, Cogentin, all without complete success.  Patient is still on primidone, 250 mg, half tablet in the morning, half a tablet in the afternoon, 50 mg at dinnertime, and another 250 mg at bedtime.  He is also on propranolol LA, 120 mg in the morning and 60 mg at night.  He is on clonazepam 0.25 mg daily.  PDMP aware is reviewed and he is still  using RX from [place] neurologist and is appropriately refilling.             -I talked to the patient about the logistics associated with DBS therapy.  I talked to the patient about risks/benefits/side effects of DBS therapy.  We talked about risks which included but were not limited to infection, paralysis, intraoperative seizure, death, stroke, bleeding around the electrode.   I talked to patient about fiducial placement 1 week prior to DBS therapy.  I talked to the patient about what to expect in the operating room, including the fact that this is an awake surgery.  We talked about battery placement as well as which is done under general anesthesia, generally approximately one week following the initial surgery.    The patient and family were given the opportunity to ask questions, which they did, and I answered them to the best of my ability today.  Patient had many questions and I answered them to the best of my ability.  If he decides to have surgery, he really would only want left VIM done since his right hand is really primarily the only thing affected.  However, he needs time to think about it, which I think is very reasonable.  I told him if he would I would like to talk to somebody that has had the surgery to let us know and we will try to hook him up with that resource.  Otherwise, we will plan to see him back in 6 months  Cc:  Pomposini, Cherly Anderson, Juan Spencer

## 2019-02-02 ENCOUNTER — Ambulatory Visit: Payer: Medicare Other | Admitting: Neurology

## 2019-02-20 NOTE — Progress Notes (Signed)
Virtual Visit via Video Note The purpose of this virtual visit is to provide medical care while limiting exposure to the novel coronavirus.    Consent was obtained for video visit:  Yes.   Answered questions that patient had about telehealth interaction:  Yes.   I discussed the limitations, risks, security and privacy concerns of performing an evaluation and management service by telemedicine. I also discussed with the patient that there may be a patient responsible charge related to this service. The patient expressed understanding and agreed to proceed.  Pt location: Home Physician Location: office Name of referring provider:  Pomposini, Cherly Anderson, MD I connected with @NAME @ at patients initiation/request on 02/22/2019 at 10:45 AM EST by video enabled telemedicine application and verified that I am speaking with the correct person using two identifiers. Pt MRN:  [id number] Pt DOB:  [birth year] Video Participants:  Juan Spencer;   Juan Spencer was seen today in follow up for Essential tremor.  Pt is currently on primidone 250 mg, half tablet in the morning and half a tablet in the afternoon and 1 tablet at bedtime.  This is in addition to an additional 50 mg at dinnertime.  He is also on propranolol LA, 120 mg in the morning and 60 mg at night and clonazepam 0.25 mg daily.  He has previously been worked up for candidacy for DBS therapy.  He had neurocognitive testing in February and there were no cognitive deficits.  He saw Dr. Vertell Limber in March, but that was primarily for back issues.  Pt states today that "doc, I haven't made up my mind yet on dbs."  Pt states that he was put on carbidopa/levodopa 10/100 tid by PCP.  It hasn't helped.  Started on it 11/9.   PREVIOUS MEDICATIONS: primidone; propranolol; artane; cogentin; klonopin; carbidopa/levodopa  ALLERGIES:   Allergies  Allergen Reactions  . Aleve [Naproxen Sodium] Anaphylaxis, Hives and Swelling  . Motrin [Ibuprofen] Anaphylaxis,  Hives and Swelling    CURRENT MEDICATIONS:  Outpatient Encounter Medications as of 02/22/2019  Medication Sig  . Aspirin-Acetaminophen-Caffeine (EXCEDRIN PO) Take by mouth as needed.  . clonazePAM (KLONOPIN) 0.25 MG disintegrating tablet Take 0.25 mg by mouth at bedtime.  . docusate sodium (COLACE) 100 MG capsule Take 100 mg by mouth 2 (two) times daily.  . Glucosamine-Chondroit-Vit C-Mn (GLUCOSAMINE 1500 COMPLEX PO) Take 2 tablets by mouth daily.   Marland Kitchen loratadine (CLARITIN) 10 MG tablet Take 10 mg by mouth daily.  . Multiple Vitamin (MULTIVITAMIN WITH MINERALS) TABS tablet Take 1 tablet by mouth 2 (two) times daily.  . Omega-3 Fatty Acids (FISH OIL) 1000 MG CAPS Take 1,000 mg by mouth 2 (two) times daily.  Marland Kitchen omeprazole (PRILOSEC) 20 MG capsule Take 20 mg by mouth daily.  . predniSONE (DELTASONE) 5 MG tablet Take 5 mg by mouth daily with breakfast.  . primidone (MYSOLINE) 250 MG tablet 1/2 in the am, 1/2 at dinner, 1 at bedtime  . primidone (MYSOLINE) 50 MG tablet Take 50 mg by mouth every evening.  . propranolol (INDERAL) 60 MG tablet Take 60 mg by mouth at bedtime.   . propranolol ER (INDERAL LA) 120 MG 24 hr capsule Take 120 mg by mouth daily.  . simvastatin (ZOCOR) 20 MG tablet Take 20 mg by mouth daily.  . traMADol (ULTRAM) 50 MG tablet Take 50 mg by mouth as needed.  . traZODone (DESYREL) 100 MG tablet Take 50 mg by mouth at bedtime.  . Vitamin Mixture (ESTER-C PO) Take  1,000 mg by mouth daily.   No facility-administered encounter medications on file as of 02/22/2019.     PAST MEDICAL HISTORY:   Past Medical History:  Diagnosis Date  . Arthritis   . Frequent urination at night   . GERD (gastroesophageal reflux disease)   . 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 Sleep apnea    uses cpap  . Tremors of nervous system     PAST SURGICAL HISTORY:   Past Surgical History:  Procedure Laterality Date  . APPENDECTOMY    . CARPAL TUNNEL RELEASE Left 2018  . HIP SURGERY Left  2016  . JOINT REPLACEMENT Left   . SHOULDER ARTHROSCOPY Right 2017  . TONSILLECTOMY    . 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 Marital status: Married    Spouse name: Not on file  . Number of children: Not on file  . Years of education: Not on file  . Highest education level: Not on file  Occupational History  . Not on file  Social Needs  . Financial resource strain: Not on file  . Food insecurity    Worry: Not on file    Inability: Not on file  . Transportation needs    Medical: Not on file    Non-medical: Not on file  Tobacco Use  . Smoking status: Never Smoker  . Smokeless tobacco: Never Used  Substance and Sexual Activity  . Alcohol use: No  . Drug use: No  . Sexual activity: Not on file  Lifestyle  . Physical activity    Days per week: Not on file    Minutes per session: Not on file  . Stress: Not on file  Relationships  . Social Musician on phone: Not on file    Gets together: Not on file    Attends religious service: Not on file    Active member of club or organization: Not on file    Attends meetings of clubs or organizations: Not on file    Relationship status: Not on file  . Intimate partner violence    Fear of current or ex partner: Not on file    Emotionally abused: Not on file    Physically abused: Not on file    Forced sexual activity: Not on file  Other Topics Concern  . Not on file  Social History Narrative  . Not on file    FAMILY HISTORY:   Family Status  Relation Name Status  . Mother  Deceased  . Father  Deceased  . Brother  Alive  . Daughter  Alive  . Son  Alive    ROS:  ROS  PHYSICAL EXAMINATION:    VITALS:  There were no vitals filed for this visit.  GEN:  The patient appears stated age and is in NAD.    Neurological examination:  Orientation: The patient is alert and oriented x3. Cranial nerves: There is good facial symmetry. The speech is fluent and clear. Hearing is intact to conversational tone. Motor: Strength is at least antigravity x4.  Movement examination: Tone: There is normal tone in the UE/LE Abnormal movements: there is significant postural tremor, right more than left. Coordination:  There is no decremation with RAM's.    ASSESSMENT/PLAN:  1.  Essential tremor             -  Patient has asymmetric essential tremor.  Patient has been on primidone, propranolol, clonazepam, trihexyphenidyl, Cogentin, all without complete success.  Patient is still on primidone, 250 mg, half tablet in the morning, half a tablet in the afternoon, 50 mg at dinnertime, and another 250 mg at bedtime.  He is also on propranolol LA, 120 mg in the morning and 60 mg at night.  He is on clonazepam 0.25 mg daily.               -discussed DBS in detail.  I talked to the patient about the logistics associated with DBS therapy.  I talked to the patient about risks/benefits/side effects of DBS therapy.  We talked about risks which included but were not limited to infection, paralysis, intraoperative seizure, death, stroke, bleeding around the electrode.   I talked to patient about fiducial placement 1 week prior to DBS therapy.  I talked to the patient about what to expect in the operating room, including the fact that this is an awake surgery.  We talked about battery placement as well as which is done under general anesthesia, generally approximately one week following the initial surgery.  We also talked about the fact that the patient will need to be off of medications for surgery.  The patient was given the opportunity to ask questions, which they did, and I answered them to the best of my ability today.  Has seen Dr. Venetia MaxonStern but not really for DBS (for back issues).  Had neurocognitive testing last Feb, 2020 and  looked good back then.  Patient had many questions today and I answered those to the best of my ability.  I did tell him that he was not going to get good tremor control without surgery, but I also told him that he needed to decide if the potential risks were worth the benefits.  He will think about it and plans to call me and let me know what he decides.  -d/c carbidopa/levodopa 10/100 tid.  Pt doesn't have PD and that medication is not going to help  Much greater than 50% of this visit was spent in counseling and coordinating care.  Total face to face time:  25 min  Cc:  Pomposini, Rande Bruntaniel L, MD

## 2019-02-22 ENCOUNTER — Other Ambulatory Visit: Payer: Self-pay

## 2019-02-22 ENCOUNTER — Telehealth (INDEPENDENT_AMBULATORY_CARE_PROVIDER_SITE_OTHER): Payer: Medicare Other | Admitting: Neurology

## 2019-02-22 DIAGNOSIS — G250 Essential tremor: Secondary | ICD-10-CM | POA: Diagnosis not present

## 2019-03-14 ENCOUNTER — Telehealth: Payer: Self-pay | Admitting: Neurology

## 2019-03-14 DIAGNOSIS — G250 Essential tremor: Secondary | ICD-10-CM

## 2019-03-14 DIAGNOSIS — Z01818 Encounter for other preprocedural examination: Secondary | ICD-10-CM

## 2019-03-14 NOTE — Telephone Encounter (Signed)
Send referral to Dr. Vertell Limber for pre-op DBS dx:  essential tremor Ask pt if he wants 1 hand (dominant hand) done or both sides.  He and I have discussed that with doing both sides, there is a 30% risk of loss of balance Order MRI brain with and without gad with DBS protocol, dx: essential tremor, pre-op DBS

## 2019-03-14 NOTE — Telephone Encounter (Signed)
Right side only. Referral faxed and MRI ordered. Messaged Albina Billet to be sure that it is scheduled correctly

## 2019-03-14 NOTE — Telephone Encounter (Signed)
Patient said to call on cell phone if you cant reach him at home. He said this is about the procedure that he was speaking with Dr. Carles Collet about. He is wanting to go ahead and get that done. Thanks!

## 2019-03-14 NOTE — Telephone Encounter (Signed)
See below, patient would like to proceed with DBS

## 2019-03-23 ENCOUNTER — Other Ambulatory Visit: Payer: Self-pay | Admitting: Neurosurgery

## 2019-04-04 ENCOUNTER — Telehealth: Payer: Self-pay | Admitting: Neurology

## 2019-04-04 NOTE — Telephone Encounter (Signed)
You can add him to the 1/28 virtual

## 2019-04-04 NOTE — Telephone Encounter (Signed)
He is schedule for a Virtual Visit with Dr Tat on 04-20-19 and patient is aware of the date and time

## 2019-04-04 NOTE — Telephone Encounter (Signed)
Patient wants to move up his appointment from April to a sooner appointment? He said that he He also had some question regarding both sides versus one side? He said that most of his medication he is taking is for his Tremors. He wants to know should he do both sides? Please Call. Thank you

## 2019-04-17 NOTE — Progress Notes (Signed)
Virtual Visit via Video Note The purpose of this virtual visit is to provide medical care while limiting exposure to the novel coronavirus.    Consent was obtained for video visit:  Yes.   Answered questions that patient had about telehealth interaction:  Yes.   I discussed the limitations, risks, security and privacy concerns of performing an evaluation and management service by telemedicine. I also discussed with the patient that there may be a patient responsible charge related to this service. The patient expressed understanding and agreed to proceed.  Pt location: Home Physician Location: office Name of referring provider:  Pomposini, Rande Brunt, MD I connected with Maisie Fus at patients initiation/request on 04/20/2019 at  9:45 AM EST by video enabled telemedicine application and verified that I am speaking with the correct person using two identifiers. Pt MRN:  [id number] Pt DOB:  [birth year] Video Participants:  Maisie Fus;     History of Present Illness:   Patient seen toda in follow-up at his request.  Patient was scheduled for unilateral VIM DBS in May.  He just recently called me back and wanted to consider bilateral, which was the reason for the visit.  In the meantime, however, his surgery got canceled because of OR shut downs with covid.  That has just been rescheduled.  Patient brings many questions today. Current movement d/o meds:  Primidone, 250 mg, half tablet in the morning, half tablet in the afternoon and 50 mg at dinnertime and another 250 mg at bedtime Propranolol LA, 120 mg in the morning and 60 mg at night Clonazepam 0.25 mg daily  PREVIOUS MEDICATIONS: primidone; propranolol; artane; cogentin; klonopin; carbidopa/levodopa   Current Outpatient Medications on File Prior to Visit  Medication Sig Dispense Refill  . Aspirin-Acetaminophen-Caffeine (EXCEDRIN PO) Take by mouth as needed.    . carbidopa-levodopa (SINEMET IR) 10-100 MG tablet Take 1 tablet by  mouth 3 (three) times daily.    . clonazePAM (KLONOPIN) 0.25 MG disintegrating tablet Take 0.25 mg by mouth at bedtime.    . docusate sodium (COLACE) 100 MG capsule Take 100 mg by mouth 2 (two) times daily.    . Glucosamine-Chondroit-Vit C-Mn (GLUCOSAMINE 1500 COMPLEX PO) Take 2 tablets by mouth daily.     Marland Kitchen loratadine (CLARITIN) 10 MG tablet Take 10 mg by mouth daily.    . Multiple Vitamin (MULTIVITAMIN WITH MINERALS) TABS tablet Take 1 tablet by mouth 2 (two) times daily.    . Omega-3 Fatty Acids (FISH OIL) 1000 MG CAPS Take 1,000 mg by mouth 2 (two) times daily.    Marland Kitchen omeprazole (PRILOSEC) 20 MG capsule Take 20 mg by mouth daily.    . predniSONE (DELTASONE) 5 MG tablet Take 5 mg by mouth daily with breakfast.    . primidone (MYSOLINE) 250 MG tablet 1/2 in the am, 1/2 at dinner, 1 at bedtime    . primidone (MYSOLINE) 50 MG tablet Take 50 mg by mouth every evening.    . propranolol (INDERAL) 60 MG tablet Take 60 mg by mouth at bedtime.     . propranolol ER (INDERAL LA) 120 MG 24 hr capsule Take 120 mg by mouth daily.    . simvastatin (ZOCOR) 20 MG tablet Take 20 mg by mouth daily.    . traMADol (ULTRAM) 50 MG tablet Take 50 mg by mouth as needed.    . traZODone (DESYREL) 100 MG tablet Take 50 mg by mouth at bedtime.    . Vitamin Mixture (ESTER-C PO) Take 1,000 mg by  mouth daily.     No current facility-administered medications on file prior to visit.     Observations/Objective:   There were no vitals filed for this visit. 100% of the visit was spent answering questions    Assessment and Plan:   1.  Severe essential tremor  -Patient has tried and failed multiple medications.  He is currently on propranolol, primidone and clonazepam.  -DBS discussed in detail.  I talked to the patient about the logistics associated with DBS therapy.  I talked to the patient about risks/benefits/side effects of DBS therapy.  We talked about risks which included but were not limited to infection,  paralysis, intraoperative seizure, death, stroke, bleeding around the electrode.   I talked to patient about fiducial placement 1 week prior to DBS therapy.  I talked to the patient about what to expect in the operating room, including the fact that this is an awake surgery.  We talked about battery placement as well as which is done under general anesthesia, generally approximately one week following the initial surgery.  We also talked about the fact that the patient will need to be off of medications for surgery.  The patient was given the opportunity to ask questions, which they did, and I answered them to the best of my ability today.  We discussed unilateral versus bilateral.  Discussed potential for balance change with bilateral surgery.  He was already scheduled for unilateral VIM surgery with Dr. Venetia Maxon in May.  This was canceled, but just got rescheduled.  Patient asked me many questions and ultimately he settled on unilateral, left VIM surgery (patient is right-hand dominant and the right hand shakes the most).  He does not have a MyChart account, so I will just give him a weaning schedule for medications when he comes in for preop video.  He asked me about going on vacation 2 weeks after surgery, and I told him I did not think that was a good idea.  I do not think he will be physically ready for that, but also do not think that his staples will even be out by that time.  -Patient has had neurocognitive testing in February, 2020.  I think that is good enough for right now.  2.  Chronic headache  -Patient really does take an excessive amount of Excedrin.  He apparently sees pain management for that (Dr. Ollen Bowl).  He asked me about the fact that he had to stop his aspirin prior to her surgery, which I agree with.  I told him he needed to talk to Dr. Ollen Bowl about alternatives, but ultimately Excedrin really is not good on a chronic basis and there are better headache medicines out there than this.  He  likely is getting rebound headache from the Excedrin.  Follow Up Instructions:  I will see him on March 18 for preop video.  -I discussed the assessment and treatment plan with the patient. The patient was provided an opportunity to ask questions and all were answered. The patient agreed with the plan and demonstrated an understanding of the instructions.   The patient was advised to call back or seek an in-person evaluation if the symptoms worsen or if the condition fails to improve as anticipated.    Total time spent on today's visit was 45 minutes, including both face-to-face time and nonface-to-face time.  Time included that spent on review of records (prior notes available to me/labs/imaging if pertinent), discussing treatment and goals, answering patient's questions and coordinating  care.   Juan Spencer Rosezetta Balderston, DO

## 2019-04-19 ENCOUNTER — Encounter: Payer: Self-pay | Admitting: Neurology

## 2019-04-20 ENCOUNTER — Telehealth (INDEPENDENT_AMBULATORY_CARE_PROVIDER_SITE_OTHER): Payer: Medicare Other | Admitting: Neurology

## 2019-04-20 ENCOUNTER — Other Ambulatory Visit: Payer: Self-pay

## 2019-04-20 VITALS — Ht 67.0 in | Wt 174.0 lb

## 2019-04-20 DIAGNOSIS — G4440 Drug-induced headache, not elsewhere classified, not intractable: Secondary | ICD-10-CM

## 2019-04-20 DIAGNOSIS — G250 Essential tremor: Secondary | ICD-10-CM

## 2019-04-24 HISTORY — PX: REPLACEMENT TOTAL KNEE: SUR1224

## 2019-04-25 ENCOUNTER — Telehealth: Payer: Self-pay | Admitting: Neurology

## 2019-04-25 NOTE — Telephone Encounter (Signed)
Please advise on below  

## 2019-04-25 NOTE — Telephone Encounter (Signed)
Patient called to let Dr. Arbutus Leas know he cancelled his upcoming neurological surgery with Dr. Venetia Maxon in April. He is planning on having surgery on both of his knees this year instead.   He'd like to put off the neurological surgery until February of 2022.  Patient has upcoming appointments that may be related to this: DBS pre-op 06/08/19, DBS follow up 08/28/19 and 09/04/19 with Dr. Arbutus Leas.   Will these need cancelled for now?  Will patient need a follow up appointment?

## 2019-04-25 NOTE — Telephone Encounter (Signed)
Yes but go ahead and hold those dates for future DBS patients, including not filling the surgery dates please

## 2019-04-26 NOTE — Telephone Encounter (Addendum)
Patient's appointments cancelled per MD.   Please confirm when patient is next due for a follow up visit.

## 2019-04-26 NOTE — Telephone Encounter (Signed)
No, since there is not really anything more to offer.  IF he wants surgery next year, he will need to let us know toward the late summer/early fall and should schedule that now.  If he is just not sure then he doesn't need a follow up and his primary care can refill his primidone.

## 2019-04-26 NOTE — Telephone Encounter (Signed)
Will patient need any follow up appointments now that not doing DBS and if so when would you like him to follow up

## 2019-04-26 NOTE — Telephone Encounter (Signed)
Will hold dates

## 2019-04-26 NOTE — Telephone Encounter (Signed)
Upon initial call, patient did indicate he wants to proceed with surgery next February 2022.   I reached out to him and he said he will call Dr. Fredrich Birks office and let them know his plans.  He will reach out to Dr. Arbutus Leas at the end of the summer/early fall with an update.

## 2019-04-26 NOTE — Telephone Encounter (Signed)
No reschedule unless he wants future surgery.

## 2019-05-23 ENCOUNTER — Telehealth: Payer: Self-pay | Admitting: Neurology

## 2019-05-23 NOTE — Telephone Encounter (Signed)
Patient would like to go sch his DBS surgery  For the Middle of Feb 2022. He wants to know if it is to early to go ahead and get that on the books  Please call

## 2019-05-23 NOTE — Telephone Encounter (Signed)
Juan Spencer, I don't think that Dr. Venetia Maxon will arrange that early but can you check with their scheduler?  If not, may want to block some time for me next year.  Juan Spencer, Pt will need follow up for me late this year though

## 2019-05-24 NOTE — Telephone Encounter (Signed)
Spoke with patient and let him know that [name] was checking with Dr Venetia Maxon office and I made him a appt on 02-29-20 to see you

## 2019-06-08 ENCOUNTER — Encounter: Payer: Medicare Other | Admitting: Neurology

## 2019-07-20 ENCOUNTER — Ambulatory Visit: Admit: 2019-07-20 | Payer: Medicare Other | Admitting: Neurosurgery

## 2019-07-20 SURGERY — MINOR PLACEMENT OF FIDUCIAL
Anesthesia: Monitor Anesthesia Care

## 2019-07-20 SURGERY — MINOR PLACEMENT OF FIDUCIAL
Anesthesia: LOCAL

## 2019-07-27 ENCOUNTER — Inpatient Hospital Stay: Admit: 2019-07-27 | Payer: Medicare Other | Admitting: Neurosurgery

## 2019-07-27 ENCOUNTER — Telehealth: Payer: Self-pay | Admitting: Neurology

## 2019-07-27 SURGERY — SUBTHALAMIC STIMULATOR INSERTION
Anesthesia: Monitor Anesthesia Care | Laterality: Left

## 2019-07-27 SURGERY — SUBTHALAMIC STIMULATOR INSERTION
Anesthesia: Monitor Anesthesia Care | Laterality: Right

## 2019-07-27 NOTE — Telephone Encounter (Signed)
Patient is returning Tee's call. He can be reached on his cell phone at [phone]

## 2019-07-27 NOTE — Telephone Encounter (Signed)
Patient states he can not have the DBS unitl February. He states he was told he can not have another surgery until after November by the doctor doing his knee surgery.  Patient states he is going to call [facility] to see if the requirements has changed and he can have his DBS sooner.

## 2019-07-27 NOTE — Telephone Encounter (Signed)
Patient states he wants to have his DBS done in February. He states he spoke with Dr Venetia Maxon and he is having another knee replacement and he was told he had to wait three months to have any other procedure. He wants to know if he could go to the dentist after having the DBS?  Patient states he wants to get his appointments and schedules line up so he will be able to go back to work in April with no mishaps. Informed patient that I would speak with Dr Tat and get back with him. He voiced understanding.

## 2019-07-27 NOTE — Telephone Encounter (Signed)
If he wants to go back to work in April, he would probably want to do it in Jan Yes, you can go the dentist after DBS

## 2019-07-27 NOTE — Telephone Encounter (Signed)
Patient called wanting to know if he could schedule his brain stimulation surgery? Would like it to be done in mid February, if possible, since he is having knee replacement surgery around September. He would also like to know if scheduling a dentist appointment 1-2 weeks prior to the surgery would affect the surgery being able to be done. Please call.

## 2019-07-27 NOTE — Telephone Encounter (Signed)
Left message with patients wife for him to contact the office.

## 2019-07-28 ENCOUNTER — Telehealth: Payer: Self-pay | Admitting: Neurology

## 2019-07-28 NOTE — Telephone Encounter (Signed)
But I would also get the MRI done for the knee BEFORE dbs because otherwise it will be more difficult with battery in place

## 2019-07-28 NOTE — Telephone Encounter (Signed)
Spoke with patient to reinterate what Dr Tat told him.   Patient wants to know if he has the DBS surgery first will he have to wait 3 months after to have knee surgery?   Patient was under the impression he was only going to need 4-5 weeks to recover from the DBS. Is this correct? Patient states he is going to call [facility] his need surgeon. He states he may switch and have this done in November. I advised patient to speak with his knee surgeon and Dr Rush Farmer office to get more clarity about his two procedures.   He voiced understanding.

## 2019-07-28 NOTE — Telephone Encounter (Signed)
Probably will need to wait a few months before a knee surgery

## 2019-07-28 NOTE — Telephone Encounter (Signed)
The patient called in to talk with [facility]. He is returning her call

## 2019-07-28 NOTE — Telephone Encounter (Signed)
Patient notified directly and voiced understanding. Patient states he is going to try to get things lined up so he can return to work in April. Advised patient to reach out to Dr Fredrich Birks about changing his knee surgery date.

## 2019-08-03 ENCOUNTER — Ambulatory Visit: Admit: 2019-08-03 | Payer: Medicare Other | Admitting: Neurosurgery

## 2019-08-03 SURGERY — UNILATERAL PULSE GENERATOR IMPLANT
Anesthesia: General | Laterality: Right

## 2019-08-03 SURGERY — UNILATERAL PULSE GENERATOR IMPLANT
Anesthesia: General | Laterality: Left

## 2019-08-28 ENCOUNTER — Encounter: Payer: Medicare Other | Admitting: Neurology

## 2019-09-01 ENCOUNTER — Telehealth: Payer: Self-pay | Admitting: Neurology

## 2019-09-01 NOTE — Telephone Encounter (Signed)
Pt is sch for 12-07-19 for the video visit with Dr Tat. He states that the surgery would need to be in Nov but after Nov 13th

## 2019-09-01 NOTE — Telephone Encounter (Signed)
We won't be able to do it right at Thanksgiving so his time constraints may not work but Juan Spencer may need to work with them

## 2019-09-01 NOTE — Telephone Encounter (Signed)
FYi

## 2019-09-01 NOTE — Telephone Encounter (Signed)
Patient called in wanting to talk with Dr.Tat to get a Brain stimulation surgery scheduled in November.

## 2019-09-01 NOTE — Telephone Encounter (Signed)
Juan Spencer, he will need appt in sept (okay to use NP 60 min as will need pre op video).  [name], can you work on scheduling?

## 2019-09-04 ENCOUNTER — Other Ambulatory Visit: Payer: Self-pay | Admitting: Neurosurgery

## 2019-09-04 ENCOUNTER — Encounter: Payer: Medicare Other | Admitting: Neurology

## 2019-09-04 DIAGNOSIS — G250 Essential tremor: Secondary | ICD-10-CM

## 2019-09-08 NOTE — Telephone Encounter (Signed)
fyi

## 2019-09-08 NOTE — Telephone Encounter (Signed)
The following message was left with [facility] on 09/07/19 at 5:04 PM.  Caller states he has a brian stimulation surgery set up. He needs to let Dr. Arbutus Leas know about the surgery. The surgery is scheduled for 02/20/20.

## 2019-09-11 NOTE — Telephone Encounter (Signed)
Yes, we are aware.  We are the ones who scheduled it.  He has an appointment in the office with me on 9/16

## 2019-12-05 NOTE — Patient Instructions (Addendum)
On 11/16 (3 weeks prior to DBS surgery):    Take primidone 50 mg, 2 in the AM, 2 in the afternoon, and 3 at bedtime  On 11/23 (2 weeks prior to DBS surgery)  Take primidone 50 mg, 1 in the AM, 1 in the afternoon, 2 at bed  On 11/30 (1 week prior to DBS surgery)  Take primidone 50 mg, 1 in the AM, 1 in the afternoon, 1 at bed  On 12/3:  Take primidone 50 mg, 1 in the AM and 1 at bed  On 12/5:  STOP Primidone  STOP Propranolol  STOP clonazepam  See you at surgery on 12/7!  We will get your MRI scheduled at [facility].  Please let me know if you don't hear from them.  That needs to be scheduled asap!  The physicians and staff at [facility] are committed to providing excellent care. You may receive a survey requesting feedback about your experience at our office. We strive to receive "very good" responses to the survey questions. If you feel that your experience would prevent you from giving the office a "very good " response, please contact our office to try to remedy the situation. We may be reached at [phone]. Thank you for taking the time out of your busy day to complete the survey.

## 2019-12-05 NOTE — Progress Notes (Signed)
Assessment/Plan:    1.  Essential Tremor  -pt now wants UNILATERAL left VIM surgery.  Discussed extensively the risks and benefits of this and I agree with his decision.  -I talked to the patient about the logistics associated with DBS therapy.  I talked to the patient about risks/benefits/side effects of DBS therapy.  We talked about risks which included but were not limited to infection, paralysis, intraoperative seizure, death, stroke, bleeding around the electrode.   I talked to patient about fiducial placement 1 week prior to DBS therapy.  I talked to the patient about what to expect in the operating room, including the fact that this is an awake surgery.  We talked about battery placement as well as which is done under general anesthesia, generally approximately one week following the initial surgery.  We also talked about the fact that the patient will need to be off of medications for surgery.  The patient was given the opportunity to ask questions, which he did, and I answered them to the best of my ability today.  -Preop MRI brain will be scheduled.  -Preop video was done today.  -Patient was given a weaning schedule for how to wean his medication preop which was as follows:  On 11/16 (3 weeks prior to DBS surgery):    Take primidone 50 mg, 2 in the AM, 2 in the afternoon, and 3 at bedtime  On 11/23 (2 weeks prior to DBS surgery)  Take primidone 50 mg, 1 in the AM, 1 in the afternoon, 2 at bed  On 11/30 (1 week prior to DBS surgery)  Take primidone 50 mg, 1 in the AM, 1 in the afternoon, 1 at bed  On 12/3:  Take primidone 50 mg, 1 in the AM and 1 at bed  On 12/5:  STOP Primidone  STOP Propranolol  STOP clonazepam    Subjective:   Juan Spencer was seen today in follow up for essential tremor.  My previous records were reviewed prior to todays visit. Pt was scheduled earlier in the year for DBS surgery, but ultimately canceled that, ended up having the surgery and  rescheduled it for later this year.  He is scheduled for surgery on December 7 with IPG placement on December 14.  Current movement d/o meds:  Primidone, 250 mg, half tablet in the morning, half tablet in the afternoon and 50 mg at dinnertime and another 250 mg at bedtime (pt already dropped the 50 mg at dinner) Propranolol LA, 120 mg in the morning and 60 mg at night Clonazepam 0.25 mg daily  PREVIOUS MEDICATIONS: primidone; propranolol; artane; cogentin; klonopin;carbidopa/levodopa  ALLERGIES:   Allergies  Allergen Reactions   Aleve [Naproxen Sodium] Anaphylaxis, Hives and Swelling   Motrin [Ibuprofen] Anaphylaxis, Hives and Swelling    CURRENT MEDICATIONS:  Outpatient Encounter Medications as of 12/07/2019  Medication Sig   Apoaequorin (PREVAGEN) 10 MG CAPS Take 1 capsule by mouth daily.   Aspirin-Acetaminophen-Caffeine (EXCEDRIN PO) Take by mouth as needed.   clonazePAM (KLONOPIN) 0.25 MG disintegrating tablet Take 0.25 mg by mouth at bedtime.   docusate sodium (COLACE) 100 MG capsule Take 100 mg by mouth 2 (two) times daily.   Glucosamine-Chondroit-Vit C-Mn (GLUCOSAMINE 1500 COMPLEX PO) Take 2 tablets by mouth daily.    montelukast (SINGULAIR) 10 MG tablet Take 10 mg by mouth daily as needed.   Multiple Vitamin (MULTIVITAMIN WITH MINERALS) TABS tablet Take 1 tablet by mouth 2 (two) times daily.   Omega-3 Fatty Acids (  FISH OIL) 1000 MG CAPS Take 1,000 mg by mouth 2 (two) times daily.   omeprazole (PRILOSEC) 20 MG capsule Take 20 mg by mouth daily.   predniSONE (DELTASONE) 5 MG tablet Take 5 mg by mouth daily with breakfast.   primidone (MYSOLINE) 250 MG tablet 1/2 in the am, 1/2 at dinner, 1 at bedtime   propranolol (INDERAL) 60 MG tablet Take 60 mg by mouth at bedtime.    simvastatin (ZOCOR) 40 MG tablet Take 40 mg by mouth daily.    tadalafil (CIALIS) 5 MG tablet Take 5 mg by mouth daily as needed for erectile dysfunction.   traZODone (DESYREL) 100 MG tablet  Take 50 mg by mouth at bedtime.   Vitamin Mixture (ESTER-C PO) Take 1,000 mg by mouth daily.   [DISCONTINUED] carbidopa-levodopa (SINEMET IR) 10-100 MG tablet Take 1 tablet by mouth 3 (three) times daily. (Patient not taking: Reported on 12/07/2019)   [DISCONTINUED] loratadine (CLARITIN) 10 MG tablet Take 10 mg by mouth daily. (Patient not taking: Reported on 12/07/2019)   [DISCONTINUED] primidone (MYSOLINE) 50 MG tablet Take 50 mg by mouth every evening. (Patient not taking: Reported on 12/07/2019)   [DISCONTINUED] propranolol ER (INDERAL LA) 120 MG 24 hr capsule Take 120 mg by mouth daily. (Patient not taking: Reported on 12/07/2019)   [DISCONTINUED] traMADol (ULTRAM) 50 MG tablet Take 50 mg by mouth as needed. (Patient not taking: Reported on 12/07/2019)   No facility-administered encounter medications on file as of 12/07/2019.     Objective:    PHYSICAL EXAMINATION:    VITALS:   Vitals:   12/07/19 1330  BP: (!) 151/74  Pulse: (!) 48  SpO2: 96%  Weight: 180 lb (81.6 kg)  Height: 5' 6.5" (1.689 m)    GEN:  The patient appears stated age and is in NAD. HEENT:  Normocephalic, atraumatic.  The mucous membranes are moist. The superficial temporal arteries are without ropiness or tenderness. CV:  RRR Lungs:  CTAB Neck/HEME:  There are no carotid bruits bilaterally.  Neurological examination:  Orientation: The patient is alert and oriented x3. Cranial nerves: There is good facial symmetry. The speech is fluent and clear. Soft palate rises symmetrically and there is no tongue deviation. Hearing is intact to conversational tone. Sensation: Sensation is intact to light touch throughout Motor: Strength is at least antigravity x4.  Movement examination: Tone: There is normal tone in the UE/LE Abnormal movements: Patient with no rest tremor.  He has moderate to moderately severe intention tremor on the right.  He is unable to do Archimedes spirals on the right at all, but he is able  to do them fairly well on the left.  He spills water when a full glass is in the right hand, but he is able to pour it when it is in the left. Coordination:  There is no decremation with RAM's Gait and Station: The patient has no difficulty arising out of a deep-seated chair without the use of the hands. The patient's stride length is good I have reviewed and interpreted the following labs independently   Chemistry      Component Value Date/Time   NA 133 (L) 12/13/2013 0540   K 3.8 12/13/2013 0540   CL 97 12/13/2013 0540   CO2 24 12/13/2013 0540   BUN 10 12/13/2013 0540   CREATININE 0.72 12/13/2013 0540      Component Value Date/Time   CALCIUM 8.5 12/13/2013 0540   ALKPHOS 47 08/19/2010 1407   AST 73 (H) 08/19/2010  1407   ALT 78 (H) 08/19/2010 1407   BILITOT 0.5 08/19/2010 1407      Lab Results  Component Value Date   WBC 7.0 12/13/2013   HGB 13.0 12/13/2013   HCT 37.7 (L) 12/13/2013   MCV 93.3 12/13/2013   PLT 105 (L) 12/13/2013   No results found for: TSH   Chemistry      Component Value Date/Time   NA 133 (L) 12/13/2013 0540   K 3.8 12/13/2013 0540   CL 97 12/13/2013 0540   CO2 24 12/13/2013 0540   BUN 10 12/13/2013 0540   CREATININE 0.72 12/13/2013 0540      Component Value Date/Time   CALCIUM 8.5 12/13/2013 0540   ALKPHOS 47 08/19/2010 1407   AST 73 (H) 08/19/2010 1407   ALT 78 (H) 08/19/2010 1407   BILITOT 0.5 08/19/2010 1407         Total time spent on today's visit was 75 minutes, including both face-to-face time and nonface-to-face time.  Time included that spent on review of records (prior notes available to me/labs/imaging if pertinent), discussing treatment and goals, answering patient's questions and coordinating care.  Cc:  Pomposini, Rande Brunt, MD

## 2019-12-07 ENCOUNTER — Other Ambulatory Visit: Payer: Self-pay

## 2019-12-07 ENCOUNTER — Ambulatory Visit (INDEPENDENT_AMBULATORY_CARE_PROVIDER_SITE_OTHER): Payer: Medicare Other | Admitting: Neurology

## 2019-12-07 ENCOUNTER — Encounter: Payer: Self-pay | Admitting: Neurology

## 2019-12-07 VITALS — BP 151/74 | HR 48 | Ht 66.5 in | Wt 180.0 lb

## 2019-12-07 DIAGNOSIS — G250 Essential tremor: Secondary | ICD-10-CM

## 2019-12-07 DIAGNOSIS — Z01818 Encounter for other preprocedural examination: Secondary | ICD-10-CM | POA: Diagnosis not present

## 2019-12-08 ENCOUNTER — Telehealth: Payer: Self-pay

## 2019-12-08 NOTE — Telephone Encounter (Signed)
Scheduler scheduled appt for Brain MRI on 12/27/2019 at 2pm with a 1:30 arrival to register.   Left detailed message on patients machine informing him of his appointment. Advised patient to contact office with questions or concerns.

## 2019-12-12 NOTE — Telephone Encounter (Signed)
Okay for tht time.  Just make sure that it is pre-op DBS MRI protocol

## 2019-12-27 ENCOUNTER — Other Ambulatory Visit (HOSPITAL_COMMUNITY): Payer: Medicare Other

## 2019-12-29 ENCOUNTER — Other Ambulatory Visit: Payer: Self-pay

## 2019-12-29 ENCOUNTER — Ambulatory Visit (HOSPITAL_COMMUNITY)
Admission: RE | Admit: 2019-12-29 | Discharge: 2019-12-29 | Disposition: A | Discharge status: Home / Self Care | Payer: Medicare Other | Source: Ambulatory Visit | Attending: Neurology | Admitting: Neurology

## 2019-12-29 DIAGNOSIS — G250 Essential tremor: Secondary | ICD-10-CM | POA: Diagnosis present

## 2019-12-29 DIAGNOSIS — Z01818 Encounter for other preprocedural examination: Secondary | ICD-10-CM

## 2019-12-29 MED ORDER — GADOBUTROL 1 MMOL/ML IV SOLN
8.0000 mL | Freq: Once | INTRAVENOUS | Status: AC | PRN
  Administered 2019-12-29: 8 mL via INTRAVENOUS

## 2020-01-31 ENCOUNTER — Telehealth: Payer: Self-pay | Admitting: Neurology

## 2020-01-31 NOTE — Telephone Encounter (Signed)
Patient called in about the dosages of his primidone he is supposed to take before his surgery. He is wondering if he could get 50 mg tablets? He currently has 250 mg tablets. He also would like to know if there is any pre-op that he needs to do before the surgery?p

## 2020-01-31 NOTE — Telephone Encounter (Signed)
Spoke patient who states he only has 250mg  of Primidone on hand. He states he does not have 50mg  tablets. He states if he cuts the tablet in quarters it will make a mess.   Patient wants to know if we can send in 50mg  tabs to the pharmacy.   [facility] [place],  Informed patient that Dr Tat was out of the office and I would get back to him tomorrow.  He voiced understanding.

## 2020-02-01 MED ORDER — PRIMIDONE 50 MG PO TABS: ORAL_TABLET | ORAL | 0 refills | Status: DC

## 2020-02-01 NOTE — Telephone Encounter (Signed)
Please call the 50 mg into the pharmacy to accomodate the weaning schedule we gave him on his last AVS that he will follow prior to surgery

## 2020-02-01 NOTE — Telephone Encounter (Signed)
Patient notified and rx called into pharmacy.

## 2020-02-09 ENCOUNTER — Other Ambulatory Visit: Payer: Self-pay | Admitting: Neurology

## 2020-02-12 NOTE — H&P (Signed)
Patient ID:   [id number] Patient: Juan FusMarvin Coen  Date of Birth: April 29, 1945 Visit Type: Office Visit   Date: 08/31/2019 05:00 PM Provider: Danae OrleansJoseph D. Venetia MaxonStern MD   This 74 year old male presents for neck pain.  HISTORY OF PRESENT ILLNESS: 1.  neck pain  The patient presents via telehealth due to the COVID-19 pandemic   he wanted to discuss details of  deep brain stimulator placement surgery for tremor  to get a better sense of timing and to work that schedule around his knee surgery and return to work status   he is going to speak with Dr. Arbutus Leasat  to review further details of the exact nature of the surgery and her perspective on timing but I went over the surgical procedure details with him at length.    He wishes to proceed with surgery and plans to do so in November.   he says he wants to wait until after his grandson so wedding which is on the 13th of November      Medical/Surgical/Interim History Reviewed, no change.  Last detailed document date:04/11/2018.     PAST MEDICAL HISTORY, SURGICAL HISTORY, FAMILY HISTORY, SOCIAL HISTORY AND REVIEW OF SYSTEMS I have reviewed the patient's past medical, surgical, family and social history as well as the comprehensive review of systems as included on the [facility] history form dated 07/11/2018, which I have signed.  Family History: Reviewed, no changes.  Last detailed document date:04/11/2018.   Social History: Reviewed, no changes. Last detailed document date: 04/11/2018.    MEDICATIONS: (added, continued or stopped this visit) Started Medication Directions Instruction Stopped  Claritin 10 mg tablet take 1 tablet by oral route  every day    clonazepam 0.25 mg disintegrating tablet     FISH OIL  BUCCAL     omeprazole 20 mg capsule,delayed release     prednisone 5 mg tablet     prednisone 5 mg tablet take 1 tablet by oral route  every day    primidone 250 mg  tablet    05/21/2018 propranolol ER 120 mg capsule,24 hr,extended release    07/31/2018 propranolol ER 60 mg capsule,24 hr,extended release     simvastatin 20 mg tablet     Singulair 10 mg tablet take 1 tablet by oral route  every day in the evening   02/01/2019 tamsulosin 0.4 mg capsule     trazodone 100 mg tablet       ALLERGIES: Ingredient Reaction Medication Name Comment NAPROXEN SODIUM  Aleve  IBUPROFEN  Motrin   Reviewed, no changes.    PHYSICAL EXAM:  Vitals Date Temp F BP Pulse Ht In Wt Lb BMI BSA Pain Score 08/31/2019        0/10     IMPRESSION:   the patient wants to proceed with bilateral deep brain stimulator placement for tremor and wants to schedule the surgery for after February 03, 2020  PLAN:  proceed with surgery for tremor after the patient has further discussions with Dr. Arbutus LeasAT   Assessment/Plan  # Detail Type Description  1. Assessment Tremor (R25.1).       Pain Management Plan Pain Scale: 0/10. Method: Numeric Pain Intensity Scale.              Provider:  Danae OrleansJoseph D. Venetia MaxonStern MD  09/02/2019 02:20 PM    Dictation edited by: Danae OrleansJoseph D. Venetia MaxonStern    CC Providers: Georgiann Mccoyaniel  Pomposini [address] [place],  [place]  [id number]   Ivin BootyJoshua  Landau  [address] [place], [place] [address]               Electronically signed by Danae Orleans. Venetia Maxon MD on 09/02/2019 02:21 PM   Patient ID:   [id number] Patient: Juan Spencer  Date of Birth: 26-Oct-1945 Visit Type: Office Visit   Date: 08/03/2019 01:30 PM Provider: Joneen Roach PAC  Historian: self  This 74 year old male presents for pain.  HISTORY OF PRESENT ILLNESS: 1.  pain  Mr. Monforte  is a very pleasant gentleman who was last seen on June 22, 2019 for a left C4-5 radiofrequency ablation procedure and on June 15, 2019 for a right C4-5 radiofrequency ablation procedure.  He has previously found these  interventions affective.  He was hoping to proceed with a deep brain stimulator to address his tremors but unfortunately has had to cancel that.  Initially was canceled due to COVID.  He then wanted to have both of his knees replaced and Dr. Venetia Maxon did not feel that brain surgery would be appropriate at that time.  He is planning to move forward with the deep brain stimulator in 2022.  Currently he is managed by Dr. Arbutus Leas for his tremors.  The patient did proceed with a right knee replacement approximately 10 weeks ago.  They did have to go in a second time and manipulate the knee as he was not progressing with his range of motion the way they had like.  He states he is now able to flex the knee to about 120.  He feels that he is doing well and is planning to move forward with a left knee replacement in November.  He was hoping to then proceed with a deep brain stimulator in February but has some concerns about the recovery.  He needs to be able to return to work by early April.  He states the ablation procedures were both effective.  He is able to look laterally with out any pain.  He has some numbness at the procedure site.  He would report the ablation says about 75% effective.  He reports no new symptoms.  He reports no side effects.  He reports no falls.  He rates his pain at 2/10 today.      Medical/Surgical/Interim History Reviewed, no change.  Last detailed document date:04/11/2018.     PAST MEDICAL HISTORY, SURGICAL HISTORY, FAMILY HISTORY, SOCIAL HISTORY AND REVIEW OF SYSTEMS I have reviewed the patient's past medical, surgical, family and social history as well as the comprehensive review of systems as included on the [facility] history form dated 07/11/2018, which I have signed.  Family History: Reviewed, no changes.  Last detailed document date:04/11/2018.  Patient reports there is no relevant family history.   Social History: Reviewed, no changes.  Last detailed document date: 04/11/2018.    MEDICATIONS: (added, continued or stopped this visit) Started Medication Directions Instruction Stopped  Claritin 10 mg tablet take 1 tablet by oral route  every day    clonazepam 0.25 mg disintegrating tablet     FISH OIL  BUCCAL     omeprazole 20 mg capsule,delayed release     prednisone 5 mg tablet     prednisone 5 mg tablet take 1 tablet by oral route  every day    primidone 250 mg tablet    05/21/2018 propranolol ER 120 mg capsule,24 hr,extended release    07/31/2018 propranolol ER 60 mg capsule,24 hr,extended release  simvastatin 20 mg tablet     Singulair 10 mg tablet take 1 tablet by oral route  every day in the evening   02/01/2019 tamsulosin 0.4 mg capsule     trazodone 100 mg tablet       ALLERGIES: Ingredient Reaction Medication Name Comment NAPROXEN SODIUM  Aleve  IBUPROFEN  Motrin   Reviewed, no changes.   REVIEW OF SYSTEMS  See scanned patient registration form, dated 07/11/2018, signed and dated on 08/03/2019  Review of Systems Details System Neg/Pos Details Constitutional Negative Chills, Fatigue, Fever, Malaise, Night sweats, Weight gain and Weight loss. ENMT Negative Ear drainage, Hearing loss, Nasal drainage, Otalgia, Sinus pressure and Sore throat. Eyes Negative Eye discharge, Eye pain and Vision changes. Respiratory Negative Chronic cough, Cough, Dyspnea, Known TB exposure and Wheezing. Cardio Negative Chest pain, Claudication, Edema and Irregular heartbeat/palpitations. GI Negative Abdominal pain, Blood in stool, Change in stool pattern, Constipation, Decreased appetite, Diarrhea, Heartburn, Nausea and Vomiting. GU Negative Dribbling, Dysuria, Erectile dysfunction, Hematuria, Polyuria (Genitourinary), Slow stream, Urinary frequency, Urinary incontinence and Urinary retention. Endocrine Negative Cold intolerance, Heat  intolerance, Polydipsia and Polyphagia. Neuro Negative Dizziness, Extremity weakness, Gait disturbance, Headache, Memory impairment, Numbness in extremity, Seizures and Tremors. Psych Negative Anxiety, Depression and Insomnia. Integumentary Negative Brittle hair, Brittle nails, Change in shape/size of mole(s), Hair loss, Hirsutism, Hives, Pruritus, Rash and Skin lesion. MS Positive Neck pain. MS Negative Back pain, Joint pain, Joint swelling and Muscle weakness. Hema/Lymph Negative Easy bleeding, Easy bruising and Lymphadenopathy. Allergic/Immuno Negative Contact allergy, Environmental allergies, Food allergies and Seasonal allergies. Reproductive Negative Penile discharge and Sexual dysfunction.  PHYSICAL EXAM:  Vitals Date Temp F BP Pulse Ht In Wt Lb BMI BSA Pain Score 08/03/2019  120/75 64 68 181.4 27.58  2/10    PHYSICAL EXAM General General Appearance: normal Mood/Affect: normal Orientation: normal Pulses/Edema: normal Gait/Station: normal, normal toe and heel walk Coordination: normal Respiratory: non-labored during exam Pupils: equal, anicteric Level of Distress: no acute distress Overall Appearance: Normal  Respiratory Lungs: non-labored  Neurological Orientation: normal Recent and Remote Memory: normal Attention Span and Concentration:   normal Language: normal Fund of Knowledge: normal  Stability Cervical Spine: motion is without pain Right Lower Extremity: abnormal knee Left Lower Extremity: abnormal knee  Range of Motion Cervical Spine: all normal Right Lower Extremity: restricted knee flexion   Cranial Nerves II. Optic Nerve/Visual Fields: normal III. Oculomotor: normal IV. Trochlear: normal V. Trigeminal: normal VI. Abducens: normal VII. Facial: normal VIII. Acoustic/Vestibular: normal IX. Glossopharyngeal: normal X. Vagus: normal XI. Spinal Accessory: normal XII.  Hypoglossal: normal      IMPRESSION:  Chronic cervical pain  PLAN: Ultimately the patient will reach out to [place] when his symptoms began to return.  He went about nine months between procedures last time.    Assessment/Plan  # Detail Type Description  1. 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                   Provider:  Joneen Roach [facility]  08/03/2019 01:56 PM  Under the supervision of Gwynne Edinger PhD MD    Dictation edited by: Joneen Roach [facility]    CC Providers: Georgiann Mccoy [address] [place],  [place]  [phone]   Teryl Lucy  [address] [place], [place] [address]               Electronically signed by Joneen Roach PAC on 08/03/2019  01:56 PM

## 2020-02-12 NOTE — H&P (View-Only) (Signed)
Patient ID:   [id number] Patient: Juan Spencer  Date of Birth: 11/18/1945 Visit Type: Office Visit   Date: 08/31/2019 05:00 PM Provider: Meir Elwood D. Athene Schuhmacher MD   This 74 year old male presents for neck pain.  HISTORY OF PRESENT ILLNESS: 1.  neck pain  The patient presents via telehealth due to the COVID-19 pandemic   he wanted to discuss details of  deep brain stimulator placement surgery for tremor  to get a better sense of timing and to work that schedule around his knee surgery and return to work status   he is going to speak with Dr. Tat  to review further details of the exact nature of the surgery and her perspective on timing but I went over the surgical procedure details with him at length.    He wishes to proceed with surgery and plans to do so in November.   he says he wants to wait until after his grandson so wedding which is on the 13th of November      Medical/Surgical/Interim History Reviewed, no change.  Last detailed document date:04/11/2018.     PAST MEDICAL HISTORY, SURGICAL HISTORY, FAMILY HISTORY, SOCIAL HISTORY AND REVIEW OF SYSTEMS I have reviewed the patient's past medical, surgical, family and social history as well as the comprehensive review of systems as included on the [facility] history form dated 07/11/2018, which I have signed.  Family History: Reviewed, no changes.  Last detailed document date:04/11/2018.   Social History: Reviewed, no changes. Last detailed document date: 04/11/2018.    MEDICATIONS: (added, continued or stopped this visit) Started Medication Directions Instruction Stopped  Claritin 10 mg tablet take 1 tablet by oral route  every day    clonazepam 0.25 mg disintegrating tablet     FISH OIL  BUCCAL     omeprazole 20 mg capsule,delayed release     prednisone 5 mg tablet     prednisone 5 mg tablet take 1 tablet by oral route  every day    primidone 250 mg  tablet    05/21/2018 propranolol ER 120 mg capsule,24 hr,extended release    07/31/2018 propranolol ER 60 mg capsule,24 hr,extended release     simvastatin 20 mg tablet     Singulair 10 mg tablet take 1 tablet by oral route  every day in the evening   02/01/2019 tamsulosin 0.4 mg capsule     trazodone 100 mg tablet       ALLERGIES: Ingredient Reaction Medication Name Comment NAPROXEN SODIUM  Aleve  IBUPROFEN  Motrin   Reviewed, no changes.    PHYSICAL EXAM:  Vitals Date Temp F BP Pulse Ht In Wt Lb BMI BSA Pain Score 08/31/2019        0/10     IMPRESSION:   the patient wants to proceed with bilateral deep brain stimulator placement for tremor and wants to schedule the surgery for after February 03, 2020  PLAN:  proceed with surgery for tremor after the patient has further discussions with Dr. TAT   Assessment/Plan  # Detail Type Description  1. Assessment Tremor (R25.1).       Pain Management Plan Pain Scale: 0/10. Method: Numeric Pain Intensity Scale.              Provider:  Angle Karel D. Baraka Klatt MD  09/02/2019 02:20 PM    Dictation edited by: Alejo Beamer D. Sakari Raisanen    CC Providers: Daniel  Pomposini [address] [place]  [address]   Joshua   Landau  [address] [place], [place] [address]               Electronically signed by Danae Orleans. Venetia Maxon MD on 09/02/2019 02:21 PM   Patient ID:   [id number] Patient: Juan Spencer  Date of Birth: 26-Oct-1945 Visit Type: Office Visit   Date: 08/03/2019 01:30 PM Provider: Joneen Roach PAC  Historian: self  This 74 year old male presents for pain.  HISTORY OF PRESENT ILLNESS: 1.  pain  Mr. Monforte  is a very pleasant gentleman who was last seen on June 22, 2019 for a left C4-5 radiofrequency ablation procedure and on June 15, 2019 for a right C4-5 radiofrequency ablation procedure.  He has previously found these  interventions affective.  He was hoping to proceed with a deep brain stimulator to address his tremors but unfortunately has had to cancel that.  Initially was canceled due to COVID.  He then wanted to have both of his knees replaced and Dr. Venetia Maxon did not feel that brain surgery would be appropriate at that time.  He is planning to move forward with the deep brain stimulator in 2022.  Currently he is managed by Dr. Arbutus Leas for his tremors.  The patient did proceed with a right knee replacement approximately 10 weeks ago.  They did have to go in a second time and manipulate the knee as he was not progressing with his range of motion the way they had like.  He states he is now able to flex the knee to about 120.  He feels that he is doing well and is planning to move forward with a left knee replacement in November.  He was hoping to then proceed with a deep brain stimulator in February but has some concerns about the recovery.  He needs to be able to return to work by early April.  He states the ablation procedures were both effective.  He is able to look laterally with out any pain.  He has some numbness at the procedure site.  He would report the ablation says about 75% effective.  He reports no new symptoms.  He reports no side effects.  He reports no falls.  He rates his pain at 2/10 today.      Medical/Surgical/Interim History Reviewed, no change.  Last detailed document date:04/11/2018.     PAST MEDICAL HISTORY, SURGICAL HISTORY, FAMILY HISTORY, SOCIAL HISTORY AND REVIEW OF SYSTEMS I have reviewed the patient's past medical, surgical, family and social history as well as the comprehensive review of systems as included on the [facility] history form dated 07/11/2018, which I have signed.  Family History: Reviewed, no changes.  Last detailed document date:04/11/2018.  Patient reports there is no relevant family history.   Social History: Reviewed, no changes.  Last detailed document date: 04/11/2018.    MEDICATIONS: (added, continued or stopped this visit) Started Medication Directions Instruction Stopped  Claritin 10 mg tablet take 1 tablet by oral route  every day    clonazepam 0.25 mg disintegrating tablet     FISH OIL  BUCCAL     omeprazole 20 mg capsule,delayed release     prednisone 5 mg tablet     prednisone 5 mg tablet take 1 tablet by oral route  every day    primidone 250 mg tablet    05/21/2018 propranolol ER 120 mg capsule,24 hr,extended release    07/31/2018 propranolol ER 60 mg capsule,24 hr,extended release  simvastatin 20 mg tablet     Singulair 10 mg tablet take 1 tablet by oral route  every day in the evening   02/01/2019 tamsulosin 0.4 mg capsule     trazodone 100 mg tablet       ALLERGIES: Ingredient Reaction Medication Name Comment NAPROXEN SODIUM  Aleve  IBUPROFEN  Motrin   Reviewed, no changes.   REVIEW OF SYSTEMS  See scanned patient registration form, dated 07/11/2018, signed and dated on 08/03/2019  Review of Systems Details System Neg/Pos Details Constitutional Negative Chills, Fatigue, Fever, Malaise, Night sweats, Weight gain and Weight loss. ENMT Negative Ear drainage, Hearing loss, Nasal drainage, Otalgia, Sinus pressure and Sore throat. Eyes Negative Eye discharge, Eye pain and Vision changes. Respiratory Negative Chronic cough, Cough, Dyspnea, Known TB exposure and Wheezing. Cardio Negative Chest pain, Claudication, Edema and Irregular heartbeat/palpitations. GI Negative Abdominal pain, Blood in stool, Change in stool pattern, Constipation, Decreased appetite, Diarrhea, Heartburn, Nausea and Vomiting. GU Negative Dribbling, Dysuria, Erectile dysfunction, Hematuria, Polyuria (Genitourinary), Slow stream, Urinary frequency, Urinary incontinence and Urinary retention. Endocrine Negative Cold intolerance, Heat  intolerance, Polydipsia and Polyphagia. Neuro Negative Dizziness, Extremity weakness, Gait disturbance, Headache, Memory impairment, Numbness in extremity, Seizures and Tremors. Psych Negative Anxiety, Depression and Insomnia. Integumentary Negative Brittle hair, Brittle nails, Change in shape/size of mole(s), Hair loss, Hirsutism, Hives, Pruritus, Rash and Skin lesion. MS Positive Neck pain. MS Negative Back pain, Joint pain, Joint swelling and Muscle weakness. Hema/Lymph Negative Easy bleeding, Easy bruising and Lymphadenopathy. Allergic/Immuno Negative Contact allergy, Environmental allergies, Food allergies and Seasonal allergies. Reproductive Negative Penile discharge and Sexual dysfunction.  PHYSICAL EXAM:  Vitals Date Temp F BP Pulse Ht In Wt Lb BMI BSA Pain Score 08/03/2019  120/75 64 68 181.4 27.58  2/10    PHYSICAL EXAM General General Appearance: normal Mood/Affect: normal Orientation: normal Pulses/Edema: normal Gait/Station: normal, normal toe and heel walk Coordination: normal Respiratory: non-labored during exam Pupils: equal, anicteric Level of Distress: no acute distress Overall Appearance: Normal  Respiratory Lungs: non-labored  Neurological Orientation: normal Recent and Remote Memory: normal Attention Span and Concentration:   normal Language: normal Fund of Knowledge: normal  Stability Cervical Spine: motion is without pain Right Lower Extremity: abnormal knee Left Lower Extremity: abnormal knee  Range of Motion Cervical Spine: all normal Right Lower Extremity: restricted knee flexion   Cranial Nerves II. Optic Nerve/Visual Fields: normal III. Oculomotor: normal IV. Trochlear: normal V. Trigeminal: normal VI. Abducens: normal VII. Facial: normal VIII. Acoustic/Vestibular: normal IX. Glossopharyngeal: normal X. Vagus: normal XI. Spinal Accessory: normal XII.  Hypoglossal: normal      IMPRESSION:  Chronic cervical pain  PLAN: Ultimately the patient will reach out to [place] when his symptoms began to return.  He went about nine months between procedures last time.    Assessment/Plan  # Detail Type Description  1. 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                   Provider:  Joneen Roach [facility]  08/03/2019 01:56 PM  Under the supervision of Gwynne Edinger PhD MD    Dictation edited by: Joneen Roach [facility]    CC Providers: Georgiann Mccoy [address] [place],  [place]  [address]   Teryl Lucy  [address] [place], [place] [address]               Electronically signed by Joneen Roach PAC on 08/03/2019  01:56 PM

## 2020-02-12 NOTE — H&P (View-Only) (Signed)
Patient ID:   [id number] Patient: Juan Spencer  Date of Birth: 04/26/1945 Visit Type: Office Visit   Date: 08/31/2019 05:00 PM Provider: Cher Egnor D. Chasty Randal MD   This 74 year old male presents for neck pain.  HISTORY OF PRESENT ILLNESS: 1.  neck pain  The patient presents via telehealth due to the COVID-19 pandemic   he wanted to discuss details of  deep brain stimulator placement surgery for tremor  to get a better sense of timing and to work that schedule around his knee surgery and return to work status   he is going to speak with Dr. Tat  to review further details of the exact nature of the surgery and her perspective on timing but I went over the surgical procedure details with him at length.    He wishes to proceed with surgery and plans to do so in November.   he says he wants to wait until after his grandson so wedding which is on the 13th of November      Medical/Surgical/Interim History Reviewed, no change.  Last detailed document date:04/11/2018.     PAST MEDICAL HISTORY, SURGICAL HISTORY, FAMILY HISTORY, SOCIAL HISTORY AND REVIEW OF SYSTEMS I have reviewed the patient's past medical, surgical, family and social history as well as the comprehensive review of systems as included on the [facility] history form dated 07/11/2018, which I have signed.  Family History: Reviewed, no changes.  Last detailed document date:04/11/2018.   Social History: Reviewed, no changes. Last detailed document date: 04/11/2018.    MEDICATIONS: (added, continued or stopped this visit) Started Medication Directions Instruction Stopped  Claritin 10 mg tablet take 1 tablet by oral route  every day    clonazepam 0.25 mg disintegrating tablet     FISH OIL  BUCCAL     omeprazole 20 mg capsule,delayed release     prednisone 5 mg tablet     prednisone 5 mg tablet take 1 tablet by oral route  every day    primidone 250 mg  tablet    05/21/2018 propranolol ER 120 mg capsule,24 hr,extended release    07/31/2018 propranolol ER 60 mg capsule,24 hr,extended release     simvastatin 20 mg tablet     Singulair 10 mg tablet take 1 tablet by oral route  every day in the evening   02/01/2019 tamsulosin 0.4 mg capsule     trazodone 100 mg tablet       ALLERGIES: Ingredient Reaction Medication Name Comment NAPROXEN SODIUM  Aleve  IBUPROFEN  Motrin   Reviewed, no changes.    PHYSICAL EXAM:  Vitals Date Temp F BP Pulse Ht In Wt Lb BMI BSA Pain Score 08/31/2019        0/10     IMPRESSION:   the patient wants to proceed with bilateral deep brain stimulator placement for tremor and wants to schedule the surgery for after February 03, 2020  PLAN:  proceed with surgery for tremor after the patient has further discussions with Dr. TAT   Assessment/Plan  # Detail Type Description  1. Assessment Tremor (R25.1).       Pain Management Plan Pain Scale: 0/10. Method: Numeric Pain Intensity Scale.              Provider:  Caryssa Elzey D. Fruma Africa MD  09/02/2019 02:20 PM    Dictation edited by: Malon Siddall D. Birda Didonato    CC Providers: Daniel  Pomposini [address] [place]  [address]   Joshua   Landau  [address] [place], [place] [address]               Electronically signed by Danae Orleans. Venetia Maxon MD on 09/02/2019 02:21 PM   Patient ID:   [id number] Patient: Juan Spencer  Date of Birth: 26-Oct-1945 Visit Type: Office Visit   Date: 08/03/2019 01:30 PM Provider: Joneen Roach PAC  Historian: self  This 74 year old male presents for pain.  HISTORY OF PRESENT ILLNESS: 1.  pain  Juan Spencer  is a very pleasant gentleman who was last seen on June 22, 2019 for a left C4-5 radiofrequency ablation procedure and on June 15, 2019 for a right C4-5 radiofrequency ablation procedure.  He has previously found these  interventions affective.  He was hoping to proceed with a deep brain stimulator to address his tremors but unfortunately has had to cancel that.  Initially was canceled due to COVID.  He then wanted to have both of his knees replaced and Dr. Venetia Maxon did not feel that brain surgery would be appropriate at that time.  He is planning to move forward with the deep brain stimulator in 2022.  Currently he is managed by Dr. Arbutus Leas for his tremors.  The patient did proceed with a right knee replacement approximately 10 weeks ago.  They did have to go in a second time and manipulate the knee as he was not progressing with his range of motion the way they had like.  He states he is now able to flex the knee to about 120.  He feels that he is doing well and is planning to move forward with a left knee replacement in November.  He was hoping to then proceed with a deep brain stimulator in February but has some concerns about the recovery.  He needs to be able to return to work by early April.  He states the ablation procedures were both effective.  He is able to look laterally with out any pain.  He has some numbness at the procedure site.  He would report the ablation says about 75% effective.  He reports no new symptoms.  He reports no side effects.  He reports no falls.  He rates his pain at 2/10 today.      Medical/Surgical/Interim History Reviewed, no change.  Last detailed document date:04/11/2018.     PAST MEDICAL HISTORY, SURGICAL HISTORY, FAMILY HISTORY, SOCIAL HISTORY AND REVIEW OF SYSTEMS I have reviewed the patient's past medical, surgical, family and social history as well as the comprehensive review of systems as included on the [facility] history form dated 07/11/2018, which I have signed.  Family History: Reviewed, no changes.  Last detailed document date:04/11/2018.  Patient reports there is no relevant family history.   Social History: Reviewed, no changes.  Last detailed document date: 04/11/2018.    MEDICATIONS: (added, continued or stopped this visit) Started Medication Directions Instruction Stopped  Claritin 10 mg tablet take 1 tablet by oral route  every day    clonazepam 0.25 mg disintegrating tablet     FISH OIL  BUCCAL     omeprazole 20 mg capsule,delayed release     prednisone 5 mg tablet     prednisone 5 mg tablet take 1 tablet by oral route  every day    primidone 250 mg tablet    05/21/2018 propranolol ER 120 mg capsule,24 hr,extended release    07/31/2018 propranolol ER 60 mg capsule,24 hr,extended release  simvastatin 20 mg tablet     Singulair 10 mg tablet take 1 tablet by oral route  every day in the evening   02/01/2019 tamsulosin 0.4 mg capsule     trazodone 100 mg tablet       ALLERGIES: Ingredient Reaction Medication Name Comment NAPROXEN SODIUM  Aleve  IBUPROFEN  Motrin   Reviewed, no changes.   REVIEW OF SYSTEMS  See scanned patient registration form, dated 07/11/2018, signed and dated on 08/03/2019  Review of Systems Details System Neg/Pos Details Constitutional Negative Chills, Fatigue, Fever, Malaise, Night sweats, Weight gain and Weight loss. ENMT Negative Ear drainage, Hearing loss, Nasal drainage, Otalgia, Sinus pressure and Sore throat. Eyes Negative Eye discharge, Eye pain and Vision changes. Respiratory Negative Chronic cough, Cough, Dyspnea, Known TB exposure and Wheezing. Cardio Negative Chest pain, Claudication, Edema and Irregular heartbeat/palpitations. GI Negative Abdominal pain, Blood in stool, Change in stool pattern, Constipation, Decreased appetite, Diarrhea, Heartburn, Nausea and Vomiting. GU Negative Dribbling, Dysuria, Erectile dysfunction, Hematuria, Polyuria (Genitourinary), Slow stream, Urinary frequency, Urinary incontinence and Urinary retention. Endocrine Negative Cold intolerance, Heat  intolerance, Polydipsia and Polyphagia. Neuro Negative Dizziness, Extremity weakness, Gait disturbance, Headache, Memory impairment, Numbness in extremity, Seizures and Tremors. Psych Negative Anxiety, Depression and Insomnia. Integumentary Negative Brittle hair, Brittle nails, Change in shape/size of mole(s), Hair loss, Hirsutism, Hives, Pruritus, Rash and Skin lesion. MS Positive Neck pain. MS Negative Back pain, Joint pain, Joint swelling and Muscle weakness. Hema/Lymph Negative Easy bleeding, Easy bruising and Lymphadenopathy. Allergic/Immuno Negative Contact allergy, Environmental allergies, Food allergies and Seasonal allergies. Reproductive Negative Penile discharge and Sexual dysfunction.  PHYSICAL EXAM:  Vitals Date Temp F BP Pulse Ht In Wt Lb BMI BSA Pain Score 08/03/2019  120/75 64 68 181.4 27.58  2/10    PHYSICAL EXAM General General Appearance: normal Mood/Affect: normal Orientation: normal Pulses/Edema: normal Gait/Station: normal, normal toe and heel walk Coordination: normal Respiratory: non-labored during exam Pupils: equal, anicteric Level of Distress: no acute distress Overall Appearance: Normal  Respiratory Lungs: non-labored  Neurological Orientation: normal Recent and Remote Memory: normal Attention Span and Concentration:   normal Language: normal Fund of Knowledge: normal  Stability Cervical Spine: motion is without pain Right Lower Extremity: abnormal knee Left Lower Extremity: abnormal knee  Range of Motion Cervical Spine: all normal Right Lower Extremity: restricted knee flexion   Cranial Nerves II. Optic Nerve/Visual Fields: normal III. Oculomotor: normal IV. Trochlear: normal V. Trigeminal: normal VI. Abducens: normal VII. Facial: normal VIII. Acoustic/Vestibular: normal IX. Glossopharyngeal: normal X. Vagus: normal XI. Spinal Accessory: normal XII.  Hypoglossal: normal      IMPRESSION:  Chronic cervical pain  PLAN: Ultimately the patient will reach out to [place] when his symptoms began to return.  He went about nine months between procedures last time.    Assessment/Plan  # Detail Type Description  1. 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                   Provider:  Joneen Roach [facility]  08/03/2019 01:56 PM  Under the supervision of Gwynne Edinger PhD MD    Dictation edited by: Joneen Roach [facility]    CC Providers: Georgiann Mccoy [address] [place],  [place]  [address]   Teryl Lucy  [address] [place], [place] [address]               Electronically signed by Joneen Roach PAC on 08/03/2019  01:56 PM

## 2020-02-16 ENCOUNTER — Other Ambulatory Visit: Payer: Self-pay | Admitting: Neurology

## 2020-02-20 ENCOUNTER — Ambulatory Visit (HOSPITAL_COMMUNITY)
Admission: RE | Admit: 2020-02-20 | Discharge: 2020-02-20 | Disposition: A | Discharge status: Home / Self Care | Payer: Medicare Other | Attending: Neurosurgery | Admitting: Neurosurgery

## 2020-02-20 ENCOUNTER — Encounter (HOSPITAL_COMMUNITY)
Admission: RE | Disposition: A | Discharge status: Home / Self Care | Payer: Self-pay | Source: Home / Self Care | Attending: Neurosurgery

## 2020-02-20 ENCOUNTER — Encounter (HOSPITAL_COMMUNITY): Payer: Self-pay | Admitting: Neurosurgery

## 2020-02-20 ENCOUNTER — Ambulatory Visit (HOSPITAL_COMMUNITY)
Admission: RE | Admit: 2020-02-20 | Discharge: 2020-02-20 | Disposition: A | Discharge status: Home / Self Care | Payer: Medicare Other | Source: Ambulatory Visit | Attending: Neurosurgery | Admitting: Neurosurgery

## 2020-02-20 DIAGNOSIS — G250 Essential tremor: Secondary | ICD-10-CM | POA: Insufficient documentation

## 2020-02-20 DIAGNOSIS — Z7982 Long term (current) use of aspirin: Secondary | ICD-10-CM | POA: Insufficient documentation

## 2020-02-20 DIAGNOSIS — Z79899 Other long term (current) drug therapy: Secondary | ICD-10-CM | POA: Insufficient documentation

## 2020-02-20 SURGERY — MINOR PLACEMENT OF FIDUCIAL
Anesthesia: LOCAL

## 2020-02-20 MED ORDER — BACITRACIN-NEOMYCIN-POLYMYXIN OINTMENT TUBE
TOPICAL_OINTMENT | CUTANEOUS | Status: AC
  Filled 2020-02-20: qty 14.17

## 2020-02-20 MED ORDER — LIDOCAINE-EPINEPHRINE 1 %-1:100000 IJ SOLN
INTRAMUSCULAR | Status: AC
  Filled 2020-02-20: qty 1

## 2020-02-20 SURGICAL SUPPLY — 19 items
BAG ATCL THK3 35X25 (MISCELLANEOUS) ×1 IMPLANT
BAG BIOHAZARD 25X35 (MISCELLANEOUS) ×1
BLADE CLIPPER SPEC (BLADE) ×2 IMPLANT
BLADE SURG 11 STRL SS (BLADE) ×2 IMPLANT
BNDG ADH 1X3 SHEER STRL LF (GAUZE/BANDAGES/DRESSINGS) ×10 IMPLANT
COVER BACK TABLE 60X90IN (DRAPES) ×2 IMPLANT
COVER WAND RF STERILE (DRAPES) ×2 IMPLANT
DRAPE HALF SHEET 40X57 (DRAPES) ×2 IMPLANT
DRAPE SHEET LG 3/4 BI-LAMINATE (DRAPES) ×2 IMPLANT
GAUZE SPONGE 4X4 12PLY STRL (GAUZE/BANDAGES/DRESSINGS) ×6 IMPLANT
GLOVE BIO SURGEON STRL SZ8 (GLOVE) ×4 IMPLANT
GLOVE ECLIPSE 8.5 STRL (GLOVE) ×2 IMPLANT
NEEDLE HYPO 18GX1.5 BLUNT FILL (NEEDLE) ×2 IMPLANT
NEEDLE HYPO 25X1 1.5 SAFETY (NEEDLE) ×2 IMPLANT
SOL PREP POV-IOD 4OZ 10% (MISCELLANEOUS) ×2 IMPLANT
STAPLER SKIN PROX WIDE 3.9 (STAPLE) ×2 IMPLANT
SUT ETHILON 3 0 PS 1 (SUTURE) ×10 IMPLANT
SYR CONTROL 10ML LL (SYRINGE) ×2 IMPLANT
TOWEL GREEN STERILE (TOWEL DISPOSABLE) ×2 IMPLANT

## 2020-02-20 NOTE — Progress Notes (Signed)
After fiducial placement, patient left short stay in a wheel chair with Georgiann Cocker, RN.

## 2020-02-20 NOTE — Op Note (Signed)
Indications:  Patient has essential tremor and presents for Star Fix Fiducial Placement for upcoming DBS VIM placement.    Procedure:  Patient was brought to the minor procedure room.  His scalp had been shaved.  Areas of planned fiducial placement were marked, scalp was prepped with betadine.  Scalp was infiltrated with lidocaine with epinephrine.  Four fiducials were placed according to standard landmarks through stab incisions.  3-0 Nylon sutures were placed and sterile dressings were applied.  Patient tolerated procedure well.  He was taken to CT for surgery planning.

## 2020-02-20 NOTE — Interval H&P Note (Signed)
History and Physical Interval Note:  02/20/2020 8:48 AM  Juan Spencer  has presented today for surgery, with the diagnosis of tremor.  The various methods of treatment have been discussed with the patient and family. After consideration of risks, benefits and other options for treatment, the patient has consented to  Procedure(s) with comments: Fiducial placement (N/A) - MINOR ROOM as a surgical intervention.  The patient's history has been reviewed, patient examined, no change in status, stable for surgery.  I have reviewed the patient's chart and labs.  Questions were answered to the patient's satisfaction.     Dorian Heckle

## 2020-02-20 NOTE — Discharge Summary (Signed)
Physician Discharge Summary  Patient ID: Juan Spencer MRN: [id number] DOB/AGE: Aug 10, 1945 74 y.o.  Admit date: 02/20/2020 Discharge date: 02/20/2020  Admission Diagnoses:Tremor  Discharge Diagnoses: Tremor Active Problems:   * No active hospital problems. *   Discharged Condition: good  Hospital Course: Patient underwent placement of fiducials for upcoming deep brain stimulator placement for essential tremor.  He tolerated the procedure well and was discharged home after a planning CT was obtained.  Consults: None  Significant Diagnostic Studies: None  Treatments: surgery: Patient underwent placement of fiducials for upcoming deep brain stimulator placement for essential tremor  Discharge Exam: Blood pressure (!) 161/84, pulse (!) 54, temperature (!) 97.5 F (36.4 C), temperature source Oral, resp. rate 18, SpO2 100 %. Neurologic: Alert and oriented X 3, normal strength and tone. Normal symmetric reflexes. Normal coordination and gait Wound: CDI  Disposition: Home  Discharge Instructions    Diet - low sodium heart healthy   Complete by: As directed    Diet general   Complete by: As directed    Increase activity slowly   Complete by: As directed      Allergies as of 02/20/2020      Reactions   Aleve [naproxen Sodium] Anaphylaxis, Hives, Swelling   Motrin [ibuprofen] Anaphylaxis, Hives, Swelling      Medication List    STOP taking these medications   Excedrin Extra Strength 250-250-65 MG tablet Generic drug: aspirin-acetaminophen-caffeine     TAKE these medications   clonazePAM 0.25 MG disintegrating tablet Commonly known as: KLONOPIN Take 0.25 mg by mouth at bedtime.   docusate sodium 100 MG capsule Commonly known as: COLACE Take 200 mg by mouth 2 (two) times daily. Stool softner   ESTER-C PO Take 500 mg by mouth daily.   Fish Oil 1000 MG Caps Take 2,000 mg by mouth 2 (two) times daily.   GLUCOSAMINE 1500 COMPLEX PO Take 1 tablet by mouth in the  morning and at bedtime.   montelukast 10 MG tablet Commonly known as: SINGULAIR Take 10 mg by mouth daily as needed (In [place] for allergies).   multivitamin with minerals Tabs tablet Take 1 tablet by mouth 2 (two) times daily. Mega Men 50 +   omeprazole 20 MG capsule Commonly known as: PRILOSEC Take 20 mg by mouth daily.   OVER THE COUNTER MEDICATION Take 2,000 mg by mouth daily. CBD   predniSONE 5 MG tablet Commonly known as: DELTASONE Take 5 mg by mouth daily with breakfast.   Prevagen 10 MG Caps Generic drug: Apoaequorin Take 10 mg by mouth daily.   primidone 50 MG tablet Commonly known as: MYSOLINE 11/16 (3 weeks prior to DBS surgery) Take primidone 50 mg, 2 in the AM, 2 in the afternoon, and 3 at bedtime  On 11/23  (2 weeks prior to DBS surgery): primidone 50 mg, 1 in the AM, 1 in the afternoon, 2 at bed  On 11/30 (1 week prior to DBS surgery)   Take primidone 50 mg, 1 in the AM, 1 in the afternoon, 1 at bed  On 12/3: Take primidone 50 mg, 1 in the AM and 1 at bed  On 12/5: STOP PRIMIDONE What changed:   how much to take  how to take this  when to take this  additional instructions   propranolol 60 MG tablet Commonly known as: INDERAL Take 60-120 mg by mouth See admin instructions. Take 120 mg in the morning and 60 mg at bedtime   sildenafil 20 MG tablet Commonly  known as: REVATIO Take 100 mg by mouth daily as needed for erectile dysfunction.   simvastatin 40 MG tablet Commonly known as: ZOCOR Take 40 mg by mouth at bedtime.   tadalafil 5 MG tablet Commonly known as: CIALIS Take 5 mg by mouth at bedtime.   traZODone 100 MG tablet Commonly known as: DESYREL Take 100 mg by mouth at bedtime.        Signed: Dorian Heckle, MD 02/20/2020, 8:51 AM

## 2020-02-26 NOTE — Progress Notes (Addendum)
PCP:  Dr. Georgiann Mccoy Cardiologist:  Denies  EKG:  01/30/20 CXR:  N/A ECHO:  01/30/20 Stress Test:  Denies Cardiac Cath:  Denies  Fasting Blood Sugar-  N/A Checks Blood Sugar_N/A__ times a day  OSA/CPAP:  Yes, wears nightly.  Will bring DOS.  ASA/Blood Thinners;  No  Patient told to stop Propanolol, Klonopin and Primidone.  Last dose 02/24/20  Covid test DOS.  Patient was not told he needed a Covid test.   Patient denies shortness of breath, fever, cough, and chest pain at PAT appointment.  Patient verbalized understanding of instructions provided today at the PAT appointment.  Patient asked to review instructions at home and day of surgery.

## 2020-02-26 NOTE — Anesthesia Preprocedure Evaluation (Addendum)
Anesthesia Evaluation  Patient identified by MRN, date of birth, ID band Patient awake    Reviewed: Allergy & Precautions, NPO status , Patient's Chart, lab work & pertinent test results, reviewed documented beta blocker date and time   Airway Mallampati: II  TM Distance: >3 FB Neck ROM: Full    Dental no notable dental hx. (+) Teeth Intact, Dental Advisory Given   Pulmonary sleep apnea and Continuous Positive Airway Pressure Ventilation ,    Pulmonary exam normal breath sounds clear to auscultation       Cardiovascular negative cardio ROS Normal cardiovascular exam Rhythm:Regular Rate:Normal     Neuro/Psych Anxiety negative neurological ROS  negative psych ROS   GI/Hepatic Neg liver ROS, GERD  Medicated and Controlled,  Endo/Other  negative endocrine ROS  Renal/GU negative Renal ROS  negative genitourinary   Musculoskeletal  (+) Arthritis , Osteoarthritis,    Abdominal   Peds  Hematology negative hematology ROS (+)   Anesthesia Other Findings On Prednisone 26m QD    Reproductive/Obstetrics                            Anesthesia Physical Anesthesia Plan  ASA: II  Anesthesia Plan: MAC   Post-op Pain Management:    Induction: Intravenous  PONV Risk Score and Plan: 1 and Treatment may vary due to age or medical condition  Airway Management Planned:   Additional Equipment:   Intra-op Plan:   Post-operative Plan:   Informed Consent: I have reviewed the patients History and Physical, chart, labs and discussed the procedure including the risks, benefits and alternatives for the proposed anesthesia with the patient or authorized representative who has indicated his/her understanding and acceptance.     Dental advisory given  Plan Discussed with: CRNA  Anesthesia Plan Comments:       Anesthesia Quick Evaluation

## 2020-02-27 ENCOUNTER — Encounter (HOSPITAL_COMMUNITY): Payer: Self-pay | Admitting: Neurosurgery

## 2020-02-27 ENCOUNTER — Encounter (HOSPITAL_COMMUNITY)
Admission: RE | Disposition: A | Discharge status: Home / Self Care | Payer: Self-pay | Source: Home / Self Care | Attending: Neurosurgery

## 2020-02-27 ENCOUNTER — Inpatient Hospital Stay (HOSPITAL_COMMUNITY): Payer: Medicare Other | Admitting: Certified Registered Nurse Anesthetist

## 2020-02-27 ENCOUNTER — Other Ambulatory Visit: Payer: Self-pay

## 2020-02-27 ENCOUNTER — Inpatient Hospital Stay (HOSPITAL_COMMUNITY)
Admission: RE | Admit: 2020-02-27 | Discharge: 2020-02-28 | DRG: 027 | Disposition: A | Discharge status: Home / Self Care | Payer: Medicare Other | Attending: Neurosurgery | Admitting: Neurosurgery

## 2020-02-27 ENCOUNTER — Inpatient Hospital Stay (HOSPITAL_COMMUNITY): Payer: Medicare Other

## 2020-02-27 DIAGNOSIS — Z7952 Long term (current) use of systemic steroids: Secondary | ICD-10-CM | POA: Diagnosis not present

## 2020-02-27 DIAGNOSIS — Z888 Allergy status to other drugs, medicaments and biological substances status: Secondary | ICD-10-CM | POA: Diagnosis not present

## 2020-02-27 DIAGNOSIS — Z20822 Contact with and (suspected) exposure to covid-19: Secondary | ICD-10-CM | POA: Diagnosis present

## 2020-02-27 DIAGNOSIS — M47812 Spondylosis without myelopathy or radiculopathy, cervical region: Secondary | ICD-10-CM | POA: Diagnosis present

## 2020-02-27 DIAGNOSIS — F419 Anxiety disorder, unspecified: Secondary | ICD-10-CM | POA: Diagnosis present

## 2020-02-27 DIAGNOSIS — G4730 Sleep apnea, unspecified: Secondary | ICD-10-CM | POA: Diagnosis present

## 2020-02-27 DIAGNOSIS — Z79899 Other long term (current) drug therapy: Secondary | ICD-10-CM | POA: Diagnosis not present

## 2020-02-27 DIAGNOSIS — K219 Gastro-esophageal reflux disease without esophagitis: Secondary | ICD-10-CM | POA: Diagnosis present

## 2020-02-27 DIAGNOSIS — M1990 Unspecified osteoarthritis, unspecified site: Secondary | ICD-10-CM | POA: Diagnosis present

## 2020-02-27 DIAGNOSIS — Z96651 Presence of right artificial knee joint: Secondary | ICD-10-CM | POA: Diagnosis present

## 2020-02-27 DIAGNOSIS — R202 Paresthesia of skin: Secondary | ICD-10-CM | POA: Diagnosis not present

## 2020-02-27 DIAGNOSIS — G250 Essential tremor: Secondary | ICD-10-CM | POA: Diagnosis present

## 2020-02-27 DIAGNOSIS — R251 Tremor, unspecified: Secondary | ICD-10-CM | POA: Diagnosis present

## 2020-02-27 DIAGNOSIS — Z9689 Presence of other specified functional implants: Secondary | ICD-10-CM

## 2020-02-27 HISTORY — PX: SUBTHALAMIC STIMULATOR INSERTION: SHX5375

## 2020-02-27 LAB — CBC
HCT: 41.5 % (ref 39.0–52.0)
Hemoglobin: 14.3 g/dL (ref 13.0–17.0)
MCH: 33.1 pg (ref 26.0–34.0)
MCHC: 34.5 g/dL (ref 30.0–36.0)
MCV: 96.1 fL (ref 80.0–100.0)
Platelets: 144 10*3/uL — ABNORMAL LOW (ref 150–400)
RBC: 4.32 MIL/uL (ref 4.22–5.81)
RDW: 12.5 % (ref 11.5–15.5)
WBC: 6.7 10*3/uL (ref 4.0–10.5)
nRBC: 0 % (ref 0.0–0.2)

## 2020-02-27 LAB — BASIC METABOLIC PANEL
Anion gap: 11 (ref 5–15)
BUN: 15 mg/dL (ref 8–23)
CO2: 28 mmol/L (ref 22–32)
Calcium: 9.4 mg/dL (ref 8.9–10.3)
Chloride: 100 mmol/L (ref 98–111)
Creatinine, Ser: 0.9 mg/dL (ref 0.61–1.24)
GFR, Estimated: >60 mL/min (ref >60)
Glucose, Bld: 104 mg/dL — ABNORMAL HIGH (ref 70–99)
Potassium: 4.2 mmol/L (ref 3.5–5.1)
Sodium: 139 mmol/L (ref 135–145)

## 2020-02-27 LAB — SURGICAL PCR SCREEN
MRSA, PCR: NEGATIVE
Staphylococcus aureus: NEGATIVE

## 2020-02-27 LAB — SARS CORONAVIRUS 2 BY RT PCR (HOSPITAL ORDER, PERFORMED IN ~~LOC~~ HOSPITAL LAB): SARS Coronavirus 2: NEGATIVE

## 2020-02-27 LAB — TYPE AND SCREEN
ABO/RH(D): B POS
Antibody Screen: NEGATIVE

## 2020-02-27 SURGERY — SUBTHALAMIC STIMULATOR INSERTION
Anesthesia: Monitor Anesthesia Care | Site: Head | Laterality: Left

## 2020-02-27 MED ORDER — SODIUM BICARBONATE 4 % IV SOLN
INTRAVENOUS | Status: DC | PRN
  Administered 2020-02-27: 37 mL via INTRADERMAL

## 2020-02-27 MED ORDER — POLYETHYLENE GLYCOL 3350 17 G PO PACK: 17.0000 g | PACK | Freq: Every day | ORAL | Status: DC | PRN

## 2020-02-27 MED ORDER — ORAL CARE MOUTH RINSE: 15.0000 mL | Freq: Once | OROMUCOSAL | Status: AC

## 2020-02-27 MED ORDER — HYDROCODONE-ACETAMINOPHEN 5-325 MG PO TABS
2.0000 | ORAL_TABLET | ORAL | Status: DC | PRN
  Administered 2020-02-27 – 2020-02-28 (×5): 2 via ORAL
  Filled 2020-02-27 (×5): qty 2

## 2020-02-27 MED ORDER — DOCUSATE SODIUM 100 MG PO CAPS: 100.0000 mg | ORAL_CAPSULE | Freq: Two times a day (BID) | ORAL | Status: DC

## 2020-02-27 MED ORDER — PANTOPRAZOLE SODIUM 40 MG PO TBEC
40.0000 mg | DELAYED_RELEASE_TABLET | Freq: Every day | ORAL | Status: DC
  Administered 2020-02-27 – 2020-02-28 (×2): 40 mg via ORAL
  Filled 2020-02-27 (×2): qty 1

## 2020-02-27 MED ORDER — THROMBIN 5000 UNITS EX SOLR
CUTANEOUS | Status: AC
  Filled 2020-02-27: qty 5000

## 2020-02-27 MED ORDER — SODIUM CHLORIDE 0.9% FLUSH
3.0000 mL | Freq: Two times a day (BID) | INTRAVENOUS | Status: DC
  Administered 2020-02-27 (×2): 3 mL via INTRAVENOUS

## 2020-02-27 MED ORDER — PREDNISONE 5 MG PO TABS
5.0000 mg | ORAL_TABLET | Freq: Every day | ORAL | Status: DC
  Administered 2020-02-28: 5 mg via ORAL
  Filled 2020-02-27: qty 1

## 2020-02-27 MED ORDER — CHLORHEXIDINE GLUCONATE 0.12 % MT SOLN
15.0000 mL | Freq: Once | OROMUCOSAL | Status: AC
  Administered 2020-02-27: 15 mL via OROMUCOSAL
  Filled 2020-02-27: qty 15

## 2020-02-27 MED ORDER — 0.9 % SODIUM CHLORIDE (POUR BTL) OPTIME
TOPICAL | Status: DC | PRN
  Administered 2020-02-27: 1000 mL

## 2020-02-27 MED ORDER — DEXMEDETOMIDINE HCL IN NACL 400 MCG/100ML IV SOLN
0.4000 ug/kg/h | INTRAVENOUS | Status: DC
  Filled 2020-02-27: qty 100

## 2020-02-27 MED ORDER — MIDAZOLAM HCL 2 MG/2ML IJ SOLN
INTRAMUSCULAR | Status: AC
  Filled 2020-02-27: qty 2

## 2020-02-27 MED ORDER — DOCUSATE SODIUM 100 MG PO CAPS
200.0000 mg | ORAL_CAPSULE | Freq: Two times a day (BID) | ORAL | Status: DC
  Administered 2020-02-27 – 2020-02-28 (×3): 200 mg via ORAL
  Filled 2020-02-27 (×4): qty 2

## 2020-02-27 MED ORDER — CEFAZOLIN SODIUM-DEXTROSE 2-4 GM/100ML-% IV SOLN
2.0000 g | Freq: Three times a day (TID) | INTRAVENOUS | Status: AC
  Administered 2020-02-27 (×2): 2 g via INTRAVENOUS
  Filled 2020-02-27 (×2): qty 100

## 2020-02-27 MED ORDER — PHENOL 1.4 % MT LIQD: 1.0000 | OROMUCOSAL | Status: DC | PRN

## 2020-02-27 MED ORDER — FENTANYL CITRATE (PF) 250 MCG/5ML IJ SOLN
INTRAMUSCULAR | Status: AC
  Filled 2020-02-27: qty 5

## 2020-02-27 MED ORDER — SODIUM CHLORIDE 0.9 % IV SOLN
250.0000 mL | INTRAVENOUS | Status: DC
  Administered 2020-02-27: 250 mL via INTRAVENOUS

## 2020-02-27 MED ORDER — ZOLPIDEM TARTRATE 5 MG PO TABS: 5.0000 mg | ORAL_TABLET | Freq: Every evening | ORAL | Status: DC | PRN

## 2020-02-27 MED ORDER — CHLORHEXIDINE GLUCONATE CLOTH 2 % EX PADS: 6.0000 | MEDICATED_PAD | Freq: Once | CUTANEOUS | Status: DC

## 2020-02-27 MED ORDER — ACETAMINOPHEN 325 MG PO TABS: 650.0000 mg | ORAL_TABLET | ORAL | Status: DC | PRN

## 2020-02-27 MED ORDER — BUPIVACAINE HCL (PF) 0.5 % IJ SOLN
INTRAMUSCULAR | Status: AC
  Filled 2020-02-27: qty 30

## 2020-02-27 MED ORDER — LIDOCAINE-EPINEPHRINE 1 %-1:100000 IJ SOLN
INTRAMUSCULAR | Status: AC
  Filled 2020-02-27: qty 1

## 2020-02-27 MED ORDER — ACETAMINOPHEN 500 MG PO TABS
1000.0000 mg | ORAL_TABLET | Freq: Once | ORAL | Status: AC
  Administered 2020-02-27: 1000 mg via ORAL
  Filled 2020-02-27: qty 2

## 2020-02-27 MED ORDER — BACITRACIN ZINC 500 UNIT/GM EX OINT
TOPICAL_OINTMENT | CUTANEOUS | Status: DC | PRN
  Administered 2020-02-27: 1 via TOPICAL

## 2020-02-27 MED ORDER — THROMBIN 5000 UNITS EX SOLR: OROMUCOSAL | Status: DC | PRN

## 2020-02-27 MED ORDER — PANTOPRAZOLE SODIUM 40 MG IV SOLR: 40.0000 mg | Freq: Every day | INTRAVENOUS | Status: DC

## 2020-02-27 MED ORDER — TRAZODONE HCL 100 MG PO TABS
100.0000 mg | ORAL_TABLET | Freq: Every day | ORAL | Status: DC
  Administered 2020-02-27: 100 mg via ORAL
  Filled 2020-02-27 (×2): qty 1

## 2020-02-27 MED ORDER — MIDAZOLAM HCL 2 MG/2ML IJ SOLN
INTRAMUSCULAR | Status: DC | PRN
  Administered 2020-02-27: 2 mg via INTRAVENOUS

## 2020-02-27 MED ORDER — OXYCODONE HCL 5 MG PO TABS
5.0000 mg | ORAL_TABLET | ORAL | Status: DC | PRN
  Administered 2020-02-28: 5 mg via ORAL
  Filled 2020-02-27: qty 1

## 2020-02-27 MED ORDER — MENTHOL 3 MG MT LOZG: 1.0000 | LOZENGE | OROMUCOSAL | Status: DC | PRN

## 2020-02-27 MED ORDER — LACTATED RINGERS IV SOLN: INTRAVENOUS | Status: DC

## 2020-02-27 MED ORDER — MORPHINE SULFATE (PF) 2 MG/ML IV SOLN: 1.0000 mg | INTRAVENOUS | Status: DC | PRN

## 2020-02-27 MED ORDER — FENTANYL CITRATE (PF) 100 MCG/2ML IJ SOLN: 25.0000 ug | INTRAMUSCULAR | Status: DC | PRN

## 2020-02-27 MED ORDER — MONTELUKAST SODIUM 10 MG PO TABS: 10.0000 mg | ORAL_TABLET | Freq: Every day | ORAL | Status: DC | PRN

## 2020-02-27 MED ORDER — SODIUM CHLORIDE 0.9% FLUSH: 3.0000 mL | INTRAVENOUS | Status: DC | PRN

## 2020-02-27 MED ORDER — ACETAMINOPHEN 650 MG RE SUPP: 650.0000 mg | RECTAL | Status: DC | PRN

## 2020-02-27 MED ORDER — ALUM & MAG HYDROXIDE-SIMETH 200-200-20 MG/5ML PO SUSP: 30.0000 mL | Freq: Four times a day (QID) | ORAL | Status: DC | PRN

## 2020-02-27 MED ORDER — BISACODYL 10 MG RE SUPP: 10.0000 mg | Freq: Every day | RECTAL | Status: DC | PRN

## 2020-02-27 MED ORDER — SIMVASTATIN 20 MG PO TABS
40.0000 mg | ORAL_TABLET | Freq: Every day | ORAL | Status: DC
  Administered 2020-02-27: 40 mg via ORAL
  Filled 2020-02-27: qty 2

## 2020-02-27 MED ORDER — FLEET ENEMA 7-19 GM/118ML RE ENEM: 1.0000 | ENEMA | Freq: Once | RECTAL | Status: DC | PRN

## 2020-02-27 MED ORDER — SODIUM CHLORIDE 0.9 % IV SOLN
0.0125 ug/kg/min | INTRAVENOUS | Status: AC
  Administered 2020-02-27: .05 ug/kg/min via INTRAVENOUS
  Filled 2020-02-27: qty 2000

## 2020-02-27 MED ORDER — CEFAZOLIN SODIUM-DEXTROSE 2-4 GM/100ML-% IV SOLN
2.0000 g | INTRAVENOUS | Status: AC
  Administered 2020-02-27: 2 g via INTRAVENOUS
  Filled 2020-02-27: qty 100

## 2020-02-27 MED ORDER — SODIUM BICARBONATE 4 % IV SOLN
10.0000 mL | Freq: Once | INTRAVENOUS | Status: DC
  Filled 2020-02-27: qty 10

## 2020-02-27 MED ORDER — ONDANSETRON HCL 4 MG PO TABS: 4.0000 mg | ORAL_TABLET | Freq: Four times a day (QID) | ORAL | Status: DC | PRN

## 2020-02-27 MED ORDER — PROPOFOL 500 MG/50ML IV EMUL
INTRAVENOUS | Status: DC | PRN
  Administered 2020-02-27: 25 ug/kg/min via INTRAVENOUS

## 2020-02-27 MED ORDER — KCL IN DEXTROSE-NACL 20-5-0.45 MEQ/L-%-% IV SOLN: INTRAVENOUS | Status: DC

## 2020-02-27 MED ORDER — ADULT MULTIVITAMIN W/MINERALS CH: 1.0000 | ORAL_TABLET | Freq: Every day | ORAL | Status: DC

## 2020-02-27 MED ORDER — PROPOFOL 10 MG/ML IV BOLUS
INTRAVENOUS | Status: AC
  Filled 2020-02-27: qty 20

## 2020-02-27 MED ORDER — PROPRANOLOL HCL 60 MG PO TABS
60.0000 mg | ORAL_TABLET | ORAL | Status: DC
Start: 2020-02-27 — End: 2020-02-27

## 2020-02-27 MED ORDER — ONDANSETRON HCL 4 MG/2ML IJ SOLN
4.0000 mg | Freq: Four times a day (QID) | INTRAMUSCULAR | Status: DC | PRN
  Administered 2020-02-27 – 2020-02-28 (×2): 4 mg via INTRAVENOUS
  Filled 2020-02-27 (×2): qty 2

## 2020-02-27 MED ORDER — BACITRACIN ZINC 500 UNIT/GM EX OINT
TOPICAL_OINTMENT | CUTANEOUS | Status: AC
  Filled 2020-02-27: qty 28.35

## 2020-02-27 MED ORDER — CLONAZEPAM 0.25 MG PO TBDP: 0.2500 mg | ORAL_TABLET | Freq: Every day | ORAL | Status: DC

## 2020-02-27 SURGICAL SUPPLY — 65 items
BIT DRILL NEURO 2X3.1 SFT TUCH (MISCELLANEOUS) IMPLANT
BLADE CLIPPER SURG (BLADE) ×4 IMPLANT
BLADE SURG 11 STRL SS (BLADE) ×2 IMPLANT
BNDG ADH 1X3 SHEER STRL LF (GAUZE/BANDAGES/DRESSINGS) IMPLANT
BNDG GAUZE ELAST 4 BULKY (GAUZE/BANDAGES/DRESSINGS) IMPLANT
BOOT SUTURE AID YELLOW STND (SUTURE) ×2 IMPLANT
CABLE DBS PUSH BUTTON 8 CONT (NEUROSURGERY SUPPLIES) ×2 IMPLANT
CABLE LEAD 1.5 GUIDELINE 5.0 (CABLE) ×2 IMPLANT
CANISTER SUCT 3000ML PPV (MISCELLANEOUS) ×2 IMPLANT
CARTRIDGE OIL MAESTRO DRILL (MISCELLANEOUS) ×1 IMPLANT
CLIP RANEY DISP (INSTRUMENTS) ×4 IMPLANT
DECANTER SPIKE VIAL GLASS SM (MISCELLANEOUS) ×4 IMPLANT
DIFFUSER DRILL AIR PNEUMATIC (MISCELLANEOUS) ×2 IMPLANT
DRAPE STERI IOBAN 125X83 (DRAPES) ×2 IMPLANT
DRILL NEURO 2X3.1 SOFT TOUCH (MISCELLANEOUS)
DRSG OPSITE 4X5.5 SM (GAUZE/BANDAGES/DRESSINGS) ×2 IMPLANT
DRSG TEGADERM 2-3/8X2-3/4 SM (GAUZE/BANDAGES/DRESSINGS) ×2 IMPLANT
DRSG TELFA 3X8 NADH (GAUZE/BANDAGES/DRESSINGS) ×2 IMPLANT
DURAPREP 26ML APPLICATOR (WOUND CARE) ×2 IMPLANT
GAUZE 4X4 16PLY RFD (DISPOSABLE) IMPLANT
GAUZE SPONGE 4X4 12PLY STRL (GAUZE/BANDAGES/DRESSINGS) IMPLANT
GLOVE BIO SURGEON STRL SZ8 (GLOVE) ×4 IMPLANT
GLOVE BIOGEL PI IND STRL 7.0 (GLOVE) ×2 IMPLANT
GLOVE BIOGEL PI IND STRL 7.5 (GLOVE) ×3 IMPLANT
GLOVE BIOGEL PI IND STRL 8 (GLOVE) ×2 IMPLANT
GLOVE BIOGEL PI IND STRL 8.5 (GLOVE) ×2 IMPLANT
GLOVE BIOGEL PI INDICATOR 7.0 (GLOVE) ×2
GLOVE BIOGEL PI INDICATOR 7.5 (GLOVE) ×3
GLOVE BIOGEL PI INDICATOR 8 (GLOVE) ×2
GLOVE BIOGEL PI INDICATOR 8.5 (GLOVE) ×2
GLOVE ECLIPSE 8.0 STRL XLNG CF (GLOVE) ×4 IMPLANT
GLOVE EXAM NITRILE XL STR (GLOVE) IMPLANT
GOWN STRL REUS W/ TWL LRG LVL3 (GOWN DISPOSABLE) IMPLANT
GOWN STRL REUS W/ TWL XL LVL3 (GOWN DISPOSABLE) ×3 IMPLANT
GOWN STRL REUS W/TWL 2XL LVL3 (GOWN DISPOSABLE) ×6 IMPLANT
GOWN STRL REUS W/TWL LRG LVL3 (GOWN DISPOSABLE)
GOWN STRL REUS W/TWL XL LVL3 (GOWN DISPOSABLE) ×3
HEMOSTAT POWDER KIT SURGIFOAM (HEMOSTASIS) ×2 IMPLANT
KIT BASIN OR (CUSTOM PROCEDURE TRAY) ×2 IMPLANT
KIT COVER BURR HOLE (Miscellaneous) ×1 IMPLANT
KIT HEADREST PASSIVE (NEUROSURGERY SUPPLIES) ×2 IMPLANT
KIT NEUROSTIM DBS 45 8 CONT (Miscellaneous) ×1 IMPLANT
KIT NEUROSTIM DBS W/BURR COVER (Miscellaneous) ×1 IMPLANT
KIT PLATFORM UNI 4LEG STARFIX (KITS) ×2 IMPLANT
KIT SURGICAL STARFIX WAYFORM (KITS) ×2 IMPLANT
KIT SUTURE REMOVAL HAMOT (SET/KITS/TRAYS/PACK) ×2 IMPLANT
KIT TURNOVER KIT B (KITS) ×2 IMPLANT
LEAD KIT DBS DIRECTIONAL 45C (Miscellaneous) ×1 IMPLANT
MARKER SKIN DUAL TIP RULER LAB (MISCELLANEOUS) ×4 IMPLANT
NEEDLE HYPO 25X1 1.5 SAFETY (NEEDLE) ×2 IMPLANT
NS IRRIG 1000ML POUR BTL (IV SOLUTION) ×2 IMPLANT
OIL CARTRIDGE MAESTRO DRILL (MISCELLANEOUS) ×2
PACK LAMINECTOMY NEURO (CUSTOM PROCEDURE TRAY) ×2 IMPLANT
PERFORATOR LRG  14-11MM (BIT) ×1
PERFORATOR LRG 14-11MM (BIT) ×1 IMPLANT
PLATEFORM ARRAY DZAP LEADPOINT (MISCELLANEOUS) ×2
PLATFORM ARRAY DZAP LEADPOINT (MISCELLANEOUS) ×2 IMPLANT
SPONGE SURGIFOAM ABS GEL SZ50 (HEMOSTASIS) IMPLANT
STAPLER SKIN PROX WIDE 3.9 (STAPLE) ×2 IMPLANT
SUT ETHILON 3 0 PS 1 (SUTURE) IMPLANT
SUT SILK 2 0 TIES 10X30 (SUTURE) ×2 IMPLANT
SUT VIC AB 2-0 CP2 18 (SUTURE) ×2 IMPLANT
TOWEL GREEN STERILE (TOWEL DISPOSABLE) ×2 IMPLANT
TOWEL GREEN STERILE FF (TOWEL DISPOSABLE) ×2 IMPLANT
WATER STERILE IRR 1000ML POUR (IV SOLUTION) ×2 IMPLANT

## 2020-02-27 NOTE — Anesthesia Procedure Notes (Signed)
Procedure Name: MAC Date/Time: 02/27/2020 8:00 AM Performed by: Kathryne Hitch, CRNA Pre-anesthesia Checklist: Patient identified, Emergency Drugs available, Suction available and Patient being monitored Patient Re-evaluated:Patient Re-evaluated prior to induction Oxygen Delivery Method: Simple face mask

## 2020-02-27 NOTE — Interval H&P Note (Signed)
History and Physical Interval Note:  02/27/2020 7:30 AM  Juan Spencer  has presented today for surgery, with the diagnosis of Tremor.  The various methods of treatment have been discussed with the patient and family. After consideration of risks, benefits and other options for treatment, the patient has consented to  Procedure(s) with comments: Left deep brain stimulator placement (Left) - 3C as a surgical intervention.  The patient's history has been reviewed, patient examined, no change in status, stable for surgery.  I have reviewed the patient's chart and labs.  Questions were answered to the patient's satisfaction.     Dorian Heckle

## 2020-02-27 NOTE — Op Note (Signed)
.  Preoperative diagnosis:  Essential tremor Postoperative diagnosis:  same Surgeon:  Maeola Harman Neurologist:  Lurena Joiner Raguel Kosloski  Indication for procedure: Determination of optimal electrode position for deep brain stimulation therapy. Complications: None   Description of procedure:  Following the incision and burr hole placement, a recording microelectrode was slowly advanced into the brain via a motorized drive.  Beginning approximately 10 mm above the target, recordings were periodically made, and the resultant brain activity was described on the neurophysiologic recording worksheet.   A total of one recording pass was obtained on the left side of the brain.    9 mm of VIM were recorded from the left side of the brain.  Stimulation from the microelectrode tip was performed 3 mm above target, resulting in resolution of tremor and no side effects.  Following this, the recording electrode was replaced by a stimulating electrode by Dr. Venetia Maxon.  Test stimulations were then obtained.  Active contacts that were used and the response to stimulation, including both adverse effects and therapeutic effects, were recorded on the neurophysiologic stimulation worksheet.  In brief, there was complete resolution of tremor following activation and stimulation.  It should be noted that the patient had a very strong lesional effect, so mostly side effects were being testing with the stimulating electrode.  The only adverse effects noted were transient paresthesias of the R hand.   Final electrode position was determined and the electrode was secured in place by Dr. Venetia Maxon on each side.  Kerin Salen, DO [facility] Director of Movement Disorders

## 2020-02-27 NOTE — Brief Op Note (Signed)
02/27/2020  10:07 AM  PATIENT:  Juan Spencer  74 y.o. male  PRE-OPERATIVE DIAGNOSIS: Essential Tremor  POST-OPERATIVE DIAGNOSIS:   Essential Tremor  PROCEDURE:  Procedure(s) with comments: Left Deep Brain Stimulator Placement (Left) - 3C Left VIM nucleus  SURGEON:  Surgeon(s) and Role:    Erline Levine, MD - Primary  PHYSICIAN ASSISTANT: Glenford Peers, NP  ASSISTANTS: Poteat, RN   ANESTHESIA:   MAC with local  EBL:  50 mL   BLOOD ADMINISTERED:none  DRAINS: none   LOCAL MEDICATIONS USED:  MARCAINE    and LIDOCAINE   SPECIMEN:  No Specimen  DISPOSITION OF SPECIMEN:  N/A  COUNTS:  YES  TOURNIQUET:  * No tourniquets in log *  DICTATION: Indications: Patient is a 74 year old man with Essential Tremor it was elected to take him to surgery for left VIM Thalamus deep brain stimulator electrode placement  Procedure: Preoperative planing was performed with volumetricCT and placement of 4 fiducial markers in the skull followed by CT of the brain also obtained volumetrically. These were then exported to create Starfix head frame with planned targeting of VIM nucleus electrodes was performed. The patient was brought to the operating room and placed in a semi-Fowler's position with her neck and stabilized in a neck holder. This was affixed to the [place] adapter. His scalp was then prepped with betadine scrub and DuraPrep and subsequently draped with an Ioban drape.with the fiducials and the skin was infiltrated with local lidocaine.  The areas of planned incision were then infiltrated with local lidocaine with epinephrine. The stepoffs were connected and the Starfix frame was assembled.  The entry points were then marked.  A curvilinear incision was made centered on the left entry point. An elevator was used to clear pericranium from the skull. The perforator it was then used to produce a 14 mm bur hole. The dura was coagulated with bipolar electrocautery.  After opening the dura and a  localizing the entry point the stylette and outer cannula were inserted into the brain. Surgifoam was placed to prevent CSF leakage at the entry site. Microelectrode recordings were then performed and  we had very good recordings from the VIM. Subsequently the stimulating electrode was placed and the patient had significant improvement in tremor on the right side of the body without significant side effects to higher voltages. The electrode was then locked into position with the stimlock cap. The redundant electrode was circularized and tunneled under the scalp. The electrode was tunneled to the left and then into the posterior scalp and locked into position. The wounds were then irrigated and closed with 2-0 Vicryl sutures and staples. The fiducials were removed and staples were placed over each of these sites. The head was washed and then sterile occlusive dressings were placed. The patient was taken to recovery having tolerated her procedure without difficulty or untoward effect. Please refer to detailed microelectrode recordings from Dr. Carles Collet for more specifics of the positioning of the electrodes. These are included in her Epic note from the detailed neural monitoring and physical exam assessment during the surgery.   PLAN OF CARE: Admit to inpatient   PATIENT DISPOSITION:  PACU - hemodynamically stable.   Delay start of Pharmacological VTE agent (>24hrs) due to surgical blood loss or risk of bleeding: yes

## 2020-02-27 NOTE — Op Note (Signed)
02/27/2020  10:07 AM  PATIENT:  Juan Spencer  74 y.o. male  PRE-OPERATIVE DIAGNOSIS: Essential Tremor  POST-OPERATIVE DIAGNOSIS:   Essential Tremor  PROCEDURE:  Procedure(s) with comments: Left Deep Brain Stimulator Placement (Left) - 3C Left VIM nucleus  SURGEON:  Surgeon(s) and Role:    Erline Levine, MD - Primary  PHYSICIAN ASSISTANT: Glenford Peers, NP  ASSISTANTS: Poteat, RN   ANESTHESIA:   MAC with local  EBL:  50 mL   BLOOD ADMINISTERED:none  DRAINS: none   LOCAL MEDICATIONS USED:  MARCAINE    and LIDOCAINE   SPECIMEN:  No Specimen  DISPOSITION OF SPECIMEN:  N/A  COUNTS:  YES  TOURNIQUET:  * No tourniquets in log *  DICTATION: Indications: Patient is a 74 year old man with Essential Tremor it was elected to take him to surgery for left VIM Thalamus deep brain stimulator electrode placement  Procedure: Preoperative planing was performed with volumetricCT and placement of 4 fiducial markers in the skull followed by CT of the brain also obtained volumetrically. These were then exported to create Starfix head frame with planned targeting of VIM nucleus electrodes was performed. The patient was brought to the operating room and placed in a semi-Fowler's position with her neck and stabilized in a neck holder. This was affixed to the Mayfield adapter. His scalp was then prepped with betadine scrub and DuraPrep and subsequently draped with an Ioban drape.with the fiducials and the skin was infiltrated with local lidocaine.  The areas of planned incision were then infiltrated with local lidocaine with epinephrine. The stepoffs were connected and the Starfix frame was assembled.  The entry points were then marked.  A curvilinear incision was made centered on the left entry point. An elevator was used to clear pericranium from the skull. The perforator it was then used to produce a 14 mm bur hole. The dura was coagulated with bipolar electrocautery.  After opening the dura and a  localizing the entry point the stylette and outer cannula were inserted into the brain. Surgifoam was placed to prevent CSF leakage at the entry site. Microelectrode recordings were then performed and  we had very good recordings from the VIM. Subsequently the stimulating electrode was placed and the patient had significant improvement in tremor on the right side of the body without significant side effects to higher voltages. The electrode was then locked into position with the stimlock cap. The redundant electrode was circularized and tunneled under the scalp. The electrode was tunneled to the left and then into the posterior scalp and locked into position. The wounds were then irrigated and closed with 2-0 Vicryl sutures and staples. The fiducials were removed and staples were placed over each of these sites. The head was washed and then sterile occlusive dressings were placed. The patient was taken to recovery having tolerated her procedure without difficulty or untoward effect. Please refer to detailed microelectrode recordings from Dr. Carles Collet for more specifics of the positioning of the electrodes. These are included in her Epic note from the detailed neural monitoring and physical exam assessment during the surgery.   PLAN OF CARE: Admit to inpatient   PATIENT DISPOSITION:  PACU - hemodynamically stable.   Delay start of Pharmacological VTE agent (>24hrs) due to surgical blood loss or risk of bleeding: yes

## 2020-02-27 NOTE — Transfer of Care (Signed)
Immediate Anesthesia Transfer of Care Note  Patient: Juan Spencer  Procedure(s) Performed: Left Deep Brain Stimulator Placement (Left Head)  Patient Location: PACU  Anesthesia Type:General  Level of Consciousness: awake, alert , oriented and patient cooperative  Airway & Oxygen Therapy: Patient Spontanous Breathing  Post-op Assessment: Report given to RN and Post -op Vital signs reviewed and stable  Post vital signs: Reviewed and stable  Last Vitals:  Vitals Value Taken Time  BP 131/83 02/27/20 1005  Temp 36.5 C 02/27/20 1005  Pulse 64 02/27/20 1015  Resp 15 02/27/20 1015  SpO2 95 % 02/27/20 1015  Vitals shown include unvalidated device data.  Last Pain:  Vitals:   02/27/20 1005  TempSrc:   PainSc: 0-No pain      Patients Stated Pain Goal: 3 (02/27/20 0659)  Complications: No complications documented.

## 2020-02-28 ENCOUNTER — Encounter (HOSPITAL_COMMUNITY): Payer: Self-pay | Admitting: Neurosurgery

## 2020-02-28 MED ORDER — MENTHOL 3 MG MT LOZG: 1.0000 | LOZENGE | OROMUCOSAL | 12 refills | Status: DC | PRN

## 2020-02-28 MED ORDER — OXYCODONE HCL 5 MG PO TABS: 5.0000 mg | ORAL_TABLET | ORAL | Status: DC | PRN

## 2020-02-28 MED ORDER — METHOCARBAMOL 500 MG PO TABS
500.0000 mg | ORAL_TABLET | Freq: Four times a day (QID) | ORAL | Status: DC | PRN
  Administered 2020-02-28: 500 mg via ORAL
  Filled 2020-02-28: qty 1

## 2020-02-28 MED ORDER — HYDROCODONE-ACETAMINOPHEN 5-325 MG PO TABS: 2.0000 | ORAL_TABLET | ORAL | 0 refills | Status: DC | PRN

## 2020-02-28 NOTE — Evaluation (Signed)
Physical Therapy Evaluation and Discharge Patient Details Name: Juan Spencer MRN: [id number] DOB: [birth year] Today's Date: 02/28/2020   History of Present Illness  74 y.o. male presenting with essential tremor s/p L VIM Thalamus deep brain stimulator electrode placement on 12/7 by Dr. Venetia Maxon. 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    Clinical Impression  Patient evaluated by Physical Therapy with no further acute PT needs identified. All education has been completed and the patient has no further questions. Pt was able to demonstrate transfers and ambulation with gross mod I to supervision for safety with no AD. Pt was educated on general safety precautions, appropriate activity progression, and car transfer. See below for any follow-up Physical Therapy or equipment needs. PT is signing off. Thank you for this referral.     Follow Up Recommendations No PT follow up;Supervision for mobility/OOB    Equipment Recommendations  None recommended by PT    Recommendations for Other Services       Precautions / Restrictions Precautions Precautions: Fall Restrictions Weight Bearing Restrictions: No      Mobility  Bed Mobility               General bed mobility comments: Pt was received sitting up in the recliner.     Transfers Overall transfer level: Modified independent Equipment used: None             General transfer comment: Increased time  Ambulation/Gait Ambulation/Gait assistance: Supervision Gait Distance (Feet): 400 Feet Assistive device: None Gait Pattern/deviations: Step-through pattern;Drifts right/left Gait velocity: WFL Gait velocity interpretation: >2.62 ft/sec, indicative of community ambulatory General Gait Details: Drifting R and L in hall but maintaining a good gait speed. No overt LOB noted but pt appeared mildly unsteady at times.   Stairs            Wheelchair Mobility    Modified Rankin (Stroke Patients Only)       Balance Overall  balance assessment: Mild deficits observed, not formally tested                                           Pertinent Vitals/Pain Pain Assessment: Faces Faces Pain Scale: Hurts even more Pain Location: Headache Pain Intervention(s): Limited activity within patient's tolerance;Monitored during session;Repositioned;Ice applied    Home Living Family/patient expects to be discharged to:: Private residence Living Arrangements: Spouse/significant other Available Help at Discharge: Family   Home Access: Stairs to enter Entrance Stairs-Rails: None Entrance Stairs-Number of Steps: 1 (From garage entry) Home Layout: One level Home Equipment: Other (comment) (Built-in shower seat)      Prior Function Level of Independence: Independent         Comments: Independent with ADLs/IADLs. Wife responsible for cooking/cleaning. Ambulated without AD at baseline.      Hand Dominance        Extremity/Trunk Assessment   Upper Extremity Assessment Upper Extremity Assessment: Overall WFL for tasks assessed    Lower Extremity Assessment Lower Extremity Assessment: Overall WFL for tasks assessed    Cervical / Trunk Assessment Cervical / Trunk Assessment: Other exceptions (s/p cranial sx. )  Communication   Communication: No difficulties  Cognition Arousal/Alertness: Awake/alert Behavior During Therapy: WFL for tasks assessed/performed Overall Cognitive Status: Within Functional Limits for tasks assessed  General Comments      Exercises     Assessment/Plan    PT Assessment Patent does not need any further PT services  PT Problem List         PT Treatment Interventions      PT Goals (Current goals can be found in the Care Plan section)  Acute Rehab PT Goals Patient Stated Goal: To return home.  PT Goal Formulation: All assessment and education complete, DC therapy    Frequency     Barriers to  discharge        Co-evaluation               AM-PAC PT "6 Clicks" Mobility  Outcome Measure Help needed turning from your back to your side while in a flat bed without using bedrails?: None Help needed moving from lying on your back to sitting on the side of a flat bed without using bedrails?: None Help needed moving to and from a bed to a chair (including a wheelchair)?: None Help needed standing up from a chair using your arms (e.g., wheelchair or bedside chair)?: None Help needed to walk in hospital room?: A Little Help needed climbing 3-5 steps with a railing? : A Little 6 Click Score: 22    End of Session Equipment Utilized During Treatment: Gait belt Activity Tolerance: Patient tolerated treatment well Patient left: in chair;with call bell/phone within reach Nurse Communication: Mobility status PT Visit Diagnosis: Pain;Unsteadiness on feet (R26.81) Pain - part of body:  (head)    Time: 7341-9379 PT Time Calculation (min) (ACUTE ONLY): 15 min   Charges:   PT Evaluation $PT Eval Low Complexity: 1 Low          Juan Spencer, PT, DPT Acute Rehabilitation Services Pager: [phone] Office: [phone]   Juan Spencer 02/28/2020, 12:57 PM

## 2020-02-28 NOTE — Anesthesia Postprocedure Evaluation (Signed)
Anesthesia Post Note  Patient: Juan Spencer  Procedure(s) Performed: Left Deep Brain Stimulator Placement (Left Head)     Patient location during evaluation: PACU Anesthesia Type: MAC Level of consciousness: awake and alert Pain management: pain level controlled Vital Signs Assessment: post-procedure vital signs reviewed and stable Respiratory status: spontaneous breathing, nonlabored ventilation, respiratory function stable and patient connected to nasal cannula oxygen Cardiovascular status: stable and blood pressure returned to baseline Postop Assessment: no apparent nausea or vomiting Anesthetic complications: no   No complications documented.  Last Vitals:  Vitals:   02/28/20 0326 02/28/20 0722  BP: (!) 148/71 137/69  Pulse: 74 69  Resp: 18 16  Temp: 37 C 36.9 C  SpO2: 93% 97%    Last Pain:  Vitals:   02/28/20 0722  TempSrc: Oral  PainSc:                  Seger Jani L Amaka Gluth

## 2020-02-28 NOTE — Evaluation (Signed)
Occupational Therapy Evaluation Patient Details Name: Juan Spencer MRN: [id number] DOB: 07-19-1945 Today's Date: 02/28/2020    History of Present Illness 74 y.o. male presenting with essential tremor s/p L VIM Thalamus deep brain stimulator electrode placement on 12/7 by Dr. Venetia Maxon. 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   Clinical Impression   PTA patient was living in a single-level private residence with his spouse and was independent with ADLs/IADLs. Patient currently presents at baseline level of function demonstrating ADL transfers, UB/LB dressing, and short-distance functional mobility with Mod I to I. Patient continues to demonstrate tremor bilaterally worsening with activities requiring [facility] including replacing battery in hearing aid. Patient reports scheduled return for final procedure to activate stimulator next week. Patient does not require continued need for acute occupational therapy services with OT to sign off at this time.     Follow Up Recommendations  No OT follow up    Equipment Recommendations  None recommended by OT    Recommendations for Other Services       Precautions / Restrictions Precautions Precautions: Fall Restrictions Weight Bearing Restrictions: No      Mobility Bed Mobility Overal bed mobility: Independent                  Transfers Overall transfer level: Independent Equipment used: None             General transfer comment: Patient demonstrates multiple sit to stand transfers from various surfaces with I.     Balance Overall balance assessment: No apparent balance deficits (not formally assessed)                                         ADL either performed or assessed with clinical judgement   ADL Overall ADL's : Needs assistance/impaired     Grooming: Independent;Standing           Upper Body Dressing : Modified independent;Sitting   Lower Body Dressing: Modified independent;Sitting/lateral  leans;Sit to/from stand   Toilet Transfer: Modified Independent   Toileting- Clothing Manipulation and Hygiene: Modified independent       Functional mobility during ADLs: Independent       Vision Baseline Vision/History: Wears glasses Wears Glasses: At all times Patient Visual Report: No change from baseline Vision Assessment?: No apparent visual deficits     Perception     Praxis      Pertinent Vitals/Pain Pain Assessment: 0-10 Pain Score: 6  Pain Location: Headache Pain Intervention(s): Limited activity within patient's tolerance;Monitored during session;Premedicated before session;Ice applied     Hand Dominance     Extremity/Trunk Assessment Upper Extremity Assessment Upper Extremity Assessment: Overall WFL for tasks assessed   Lower Extremity Assessment Lower Extremity Assessment: Defer to PT evaluation   Cervical / Trunk Assessment Cervical / Trunk Assessment: Other exceptions (s/p cranial sx. )   Communication Communication Communication: No difficulties   Cognition Arousal/Alertness: Awake/alert Behavior During Therapy: WFL for tasks assessed/performed Overall Cognitive Status: Within Functional Limits for tasks assessed                                     General Comments  Dressings in multiple areas on head.     Exercises     Shoulder Instructions      Home Living Family/patient expects to be discharged to:: Private residence  Living Arrangements: Spouse/significant other Available Help at Discharge: Family   Home Access: Stairs to enter Entrance Stairs-Number of Steps: 1 (From garage entry) Entrance Stairs-Rails: None Home Layout: One level     Bathroom Shower/Tub: Producer, television/film/video: Handicapped height     Home Equipment: Other (comment) (Built-in shower seat)          Prior Functioning/Environment Level of Independence: Independent        Comments: Independent with ADLs/IADLs. Wife responsible  for cooking/cleaning. Ambulated without AD at baseline.         OT Problem List: Pain      OT Treatment/Interventions:      OT Goals(Current goals can be found in the care plan section) Acute Rehab OT Goals Patient Stated Goal: To return home.  OT Goal Formulation: With patient  OT Frequency:     Barriers to D/C:            Co-evaluation              AM-PAC OT "6 Clicks" Daily Activity     Outcome Measure Help from another person eating meals?: None Help from another person taking care of personal grooming?: None Help from another person toileting, which includes using toliet, bedpan, or urinal?: None Help from another person bathing (including washing, rinsing, drying)?: None Help from another person to put on and taking off regular upper body clothing?: None Help from another person to put on and taking off regular lower body clothing?: None 6 Click Score: 24   End of Session Nurse Communication: Mobility status  Activity Tolerance: Patient tolerated treatment well Patient left: in chair;with call bell/phone within reach  OT Visit Diagnosis: Pain Pain - part of body:  (Head)                Time: 7654-6503 OT Time Calculation (min): 21 min Charges:  OT General Charges $OT Visit: 1 Visit OT Evaluation $OT Eval Low Complexity: 1 Low  Khalessi Blough H. OTR/L Supplemental OT, Department of rehab services [phone]  Montia Haslip R H. 02/28/2020, 8:58 AM

## 2020-02-28 NOTE — Progress Notes (Signed)
Subjective: Patient reports doing well  Objective: Vital signs in last 24 hours: Temp:  [98.4 F (36.9 C)-99 F (37.2 C)] 98.7 F (37.1 C) (12/08 1123) Pulse Rate:  [63-74] 68 (12/08 1123) Resp:  [16-18] 16 (12/08 1123) BP: (133-148)/(69-82) 143/82 (12/08 1123) SpO2:  [93 %-98 %] 95 % (12/08 1123)  Intake/Output from previous day: 12/07 0701 - 12/08 0700 In: 500 [I.V.:400; IV Piggyback:100] Out: 50 [Blood:50] Intake/Output this shift: No intake/output data recorded.  Physical Exam: Dressing CDI.  Tremor still much improved  Lab Results: Recent Labs    02/27/20 0639  WBC 6.7  HGB 14.3  HCT 41.5  PLT 144*   BMET Recent Labs    02/27/20 0639  NA 139  K 4.2  CL 100  CO2 28  GLUCOSE 104*  BUN 15  CREATININE 0.90  CALCIUM 9.4    Studies/Results: CT DBS HEAD W/O CONTRAST  Result Date: 02/27/2020 CLINICAL DATA:  74 year old male status post left deep brain stimulator placement today. EXAM: CT HEAD WITHOUT CONTRAST TECHNIQUE: Contiguous axial images were obtained from the base of the skull through the vertex without intravenous contrast. COMPARISON:  Preoperative head CT 02/20/2020. Preoperative MRI 01/04/2020 and earlier. FINDINGS: Brain: Small volume pneumocephalus at the left vertex and along the anterior left frontal convexity. Left vertex approach deep brain stimulator lead terminates in the region of the thalamus. No hemorrhage or edema evident along the course of the lead. Stable underlying bilateral gray-white matter differentiation. No midline shift, mass effect, or evidence of intracranial mass lesion. No ventriculomegaly. No intracranial hemorrhage or cortically based infarct identified. Vascular: Calcified atherosclerosis at the skull base. No suspicious intracranial vascular hyperdensity. Skull: Left vertex burr hole.  Interval removal of fiducials. 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 Other paranasal sinuses and mastoids are  stable and well pneumatized. Other: Postoperative changes to the bilateral scalp soft tissues with skin staples. Coiled left vertex scalp electrode with extracranial portion continuing along the left posterior scalp convexity. Leftward gaze, otherwise negative orbits. IMPRESSION: 1. Left vertex approach deep brain stimulator placed with no adverse features. Small volume postoperative pneumocephalus in the left hemisphere. 2. Postoperative changes to the scalp and skull, including coiled subcutaneous electrode lead on the left. Electronically Signed   By: Odessa Fleming M.D.   On: 02/27/2020 16:02    Assessment/Plan: Patient is doing well.  Discharge home.    LOS: 1 day    Dorian Heckle, MD 02/28/2020, 11:52 AM

## 2020-02-28 NOTE — Discharge Summary (Signed)
Physician Discharge Summary  Patient ID: Juan Spencer MRN: [id number] DOB/AGE: 1946/01/04 74 y.o.  Admit date: 02/27/2020 Discharge date: 02/28/2020  Admission Diagnoses:Essential tremor  Discharge Diagnoses: Essential tremor  Active Problems:   Tremor   Essential tremor   Discharged Condition: good  Hospital Course: Patient underwent awake left vim DBS placement for tremor, from which he did well. He was discharged home on POD 1.   Consults: None  Significant Diagnostic Studies: None  Treatments: surgery:  Patient underwent awake left vim DBS placement for tremor  Discharge Exam: Blood pressure (!) 143/82, pulse 68, temperature 98.7 F (37.1 C), temperature source Oral, resp. rate 16, height 5' 6.5" (1.689 m), weight 79.4 kg, SpO2 95 %. Neurologic: Alert and oriented X 3, normal strength and tone. Normal symmetric reflexes. Normal coordination and gait Wound:CDI  Disposition: Home   Discharge Instructions     Remove dressing in 72 hours   Complete by: As directed    Diet - low sodium heart healthy   Complete by: As directed    Increase activity slowly   Complete by: As directed      Allergies as of 02/28/2020      Reactions   Aleve [naproxen Sodium] Anaphylaxis, Hives, Swelling   Motrin [ibuprofen] Anaphylaxis, Hives, Swelling      Medication List    TAKE these medications   clonazePAM 0.25 MG disintegrating tablet Commonly known as: KLONOPIN Take 0.25 mg by mouth at bedtime.   docusate sodium 100 MG capsule Commonly known as: COLACE Take 200 mg by mouth 2 (two) times daily. Stool softner   ESTER-C PO Take 500 mg by mouth daily.   Fish Oil 1000 MG Caps Take 2,000 mg by mouth 2 (two) times daily.   GLUCOSAMINE 1500 COMPLEX PO Take 1 tablet by mouth in the morning and at bedtime.   HYDROcodone-acetaminophen 5-325 MG tablet Commonly known as: NORCO/VICODIN Take 2 tablets by mouth every 4 (four) hours as needed for severe pain ((score 7 to 10)).    menthol-cetylpyridinium 3 MG lozenge Commonly known as: CEPACOL Take 1 lozenge (3 mg total) by mouth as needed for sore throat.   montelukast 10 MG tablet Commonly known as: SINGULAIR Take 10 mg by mouth daily as needed (In [place] for allergies).   multivitamin with minerals Tabs tablet Take 1 tablet by mouth 2 (two) times daily. Mega Men 50 +   omeprazole 20 MG capsule Commonly known as: PRILOSEC Take 20 mg by mouth daily.   OVER THE COUNTER MEDICATION Take 2,000 mg by mouth daily. CBD   predniSONE 5 MG tablet Commonly known as: DELTASONE Take 5 mg by mouth daily with breakfast.   Prevagen 10 MG Caps Generic drug: Apoaequorin Take 10 mg by mouth daily.   primidone 50 MG tablet Commonly known as: MYSOLINE 11/16 (3 weeks prior to DBS surgery) Take primidone 50 mg, 2 in the AM, 2 in the afternoon, and 3 at bedtime  On 11/23  (2 weeks prior to DBS surgery): primidone 50 mg, 1 in the AM, 1 in the afternoon, 2 at bed  On 11/30 (1 week prior to DBS surgery)   Take primidone 50 mg, 1 in the AM, 1 in the afternoon, 1 at bed  On 12/3: Take primidone 50 mg, 1 in the AM and 1 at bed  On 12/5: STOP PRIMIDONE What changed:   how much to take  how to take this  when to take this  additional instructions   propranolol 60  MG tablet Commonly known as: INDERAL Take 60-120 mg by mouth See admin instructions. Take 120 mg in the morning and 60 mg at bedtime   sildenafil 20 MG tablet Commonly known as: REVATIO Take 100 mg by mouth daily as needed for erectile dysfunction.   simvastatin 40 MG tablet Commonly known as: ZOCOR Take 40 mg by mouth at bedtime.   tadalafil 5 MG tablet Commonly known as: CIALIS Take 5 mg by mouth at bedtime.   traZODone 100 MG tablet Commonly known as: DESYREL Take 100 mg by mouth at bedtime.        Signed: Dorian Heckle, MD 02/28/2020, 11:56 AM

## 2020-02-28 NOTE — Discharge Instructions (Signed)
Wound Care Leave incision open to air. You may shower. Do not scrub directly on incision.  Do not put any creams, lotions, or ointments on incision. Activity Walk each and every day, increasing distance each day. No lifting greater than 5 lbs.  Avoid bending, arching, and twisting.   Diet Resume your normal diet.   Call Your Doctor If Any of These Occur Redness, drainage, or swelling at the wound.  Temperature greater than 101 degrees. Severe pain not relieved by pain medication. Incision starts to come apart.  

## 2020-02-29 ENCOUNTER — Ambulatory Visit: Payer: Medicare Other | Admitting: Neurology

## 2020-02-29 ENCOUNTER — Telehealth: Payer: Self-pay | Admitting: Neurology

## 2020-02-29 NOTE — Telephone Encounter (Signed)
Patient called in and left a message stating he had surgery on 02/27/20 and Dr. Arbutus Leas took him off of some medication and he would like to find out if he should stay off of it?

## 2020-02-29 NOTE — Telephone Encounter (Signed)
He asked me that before surgery.  I had told him he could go back on it but he had a pretty strong lesional effect (meaning no tremor just because we put a lead in) so if he wants to stay off of it for now, he can.  The tremor may come back if it hasn't already though.

## 2020-03-01 ENCOUNTER — Other Ambulatory Visit (HOSPITAL_COMMUNITY)
Admission: RE | Admit: 2020-03-01 | Discharge: 2020-03-01 | Disposition: A | Discharge status: Home / Self Care | Payer: Medicare Other | Source: Ambulatory Visit | Attending: Neurosurgery | Admitting: Neurosurgery

## 2020-03-01 DIAGNOSIS — Z20822 Contact with and (suspected) exposure to covid-19: Secondary | ICD-10-CM | POA: Diagnosis not present

## 2020-03-01 DIAGNOSIS — Z01812 Encounter for preprocedural laboratory examination: Secondary | ICD-10-CM | POA: Diagnosis present

## 2020-03-01 LAB — SARS CORONAVIRUS 2 (TAT 6-24 HRS): SARS Coronavirus 2: NEGATIVE

## 2020-03-01 NOTE — Telephone Encounter (Signed)
Spoke with patient who states the tremor has come back. He states he can hold off until his appointment if that is fine with Dr Tat.

## 2020-03-04 ENCOUNTER — Encounter (HOSPITAL_COMMUNITY): Payer: Self-pay | Admitting: Neurosurgery

## 2020-03-04 ENCOUNTER — Other Ambulatory Visit: Payer: Self-pay

## 2020-03-04 NOTE — Anesthesia Preprocedure Evaluation (Addendum)
Anesthesia Evaluation  Patient identified by MRN, date of birth, ID band Patient awake    Reviewed: Allergy & Precautions, H&P , NPO status , Patient's Chart, lab work & pertinent test results  Airway Mallampati: III  TM Distance: >3 FB Neck ROM: Full    Dental no notable dental hx. (+) Partial Upper, Dental Advisory Given   Pulmonary sleep apnea and Continuous Positive Airway Pressure Ventilation ,    Pulmonary exam normal breath sounds clear to auscultation       Cardiovascular Exercise Tolerance: Good negative cardio ROS   Rhythm:Regular Rate:Normal     Neuro/Psych Tremor negative neurological ROS  negative psych ROS   GI/Hepatic Neg liver ROS, GERD  Medicated,  Endo/Other  negative endocrine ROS  Renal/GU negative Renal ROS  negative genitourinary   Musculoskeletal  (+) Arthritis , Osteoarthritis,    Abdominal   Peds  Hematology negative hematology ROS (+)   Anesthesia Other Findings   Reproductive/Obstetrics negative OB ROS                            Anesthesia Physical Anesthesia Plan  ASA: III  Anesthesia Plan: General   Post-op Pain Management:    Induction: Intravenous  PONV Risk Score and Plan: 3 and Ondansetron, Dexamethasone and Treatment may vary due to age or medical condition  Airway Management Planned: Oral ETT  Additional Equipment:   Intra-op Plan:   Post-operative Plan: Extubation in OR  Informed Consent: I have reviewed the patients History and Physical, chart, labs and discussed the procedure including the risks, benefits and alternatives for the proposed anesthesia with the patient or authorized representative who has indicated his/her understanding and acceptance.     Dental advisory given  Plan Discussed with: CRNA  Anesthesia Plan Comments:        Anesthesia Quick Evaluation

## 2020-03-04 NOTE — Progress Notes (Signed)
Mr. denies chest pain or shortness of breath. Patient was tested for Covid and has been in quarantine since that time.

## 2020-03-05 ENCOUNTER — Ambulatory Visit (HOSPITAL_COMMUNITY): Payer: Medicare Other | Admitting: Certified Registered Nurse Anesthetist

## 2020-03-05 ENCOUNTER — Encounter (HOSPITAL_COMMUNITY): Payer: Self-pay | Admitting: Neurosurgery

## 2020-03-05 ENCOUNTER — Other Ambulatory Visit: Payer: Self-pay

## 2020-03-05 ENCOUNTER — Observation Stay (HOSPITAL_COMMUNITY)
Admission: RE | Admit: 2020-03-05 | Discharge: 2020-03-05 | Disposition: A | Discharge status: Home / Self Care | Payer: Medicare Other | Attending: Neurosurgery | Admitting: Neurosurgery

## 2020-03-05 ENCOUNTER — Encounter (HOSPITAL_COMMUNITY)
Admission: RE | Disposition: A | Discharge status: Home / Self Care | Payer: Self-pay | Source: Home / Self Care | Attending: Neurosurgery

## 2020-03-05 DIAGNOSIS — Z79899 Other long term (current) drug therapy: Secondary | ICD-10-CM | POA: Diagnosis not present

## 2020-03-05 DIAGNOSIS — M542 Cervicalgia: Secondary | ICD-10-CM | POA: Diagnosis present

## 2020-03-05 DIAGNOSIS — R251 Tremor, unspecified: Secondary | ICD-10-CM | POA: Diagnosis not present

## 2020-03-05 HISTORY — PX: PULSE GENERATOR IMPLANT: SHX5370

## 2020-03-05 HISTORY — DX: Personal history of urinary calculi: Z87.442

## 2020-03-05 SURGERY — UNILATERAL PULSE GENERATOR IMPLANT
Anesthesia: General | Site: Chest | Laterality: Left

## 2020-03-05 MED ORDER — HYDROCODONE-ACETAMINOPHEN 5-325 MG PO TABS
2.0000 | ORAL_TABLET | ORAL | Status: DC | PRN
Start: 2020-03-05 — End: 2020-03-05

## 2020-03-05 MED ORDER — ONDANSETRON HCL 4 MG/2ML IJ SOLN
INTRAMUSCULAR | Status: DC | PRN
  Administered 2020-03-05: 4 mg via INTRAVENOUS

## 2020-03-05 MED ORDER — PHENOL 1.4 % MT LIQD: 1.0000 | OROMUCOSAL | Status: DC | PRN

## 2020-03-05 MED ORDER — SUGAMMADEX SODIUM 200 MG/2ML IV SOLN
INTRAVENOUS | Status: DC | PRN
  Administered 2020-03-05: 200 mg via INTRAVENOUS

## 2020-03-05 MED ORDER — ALUM & MAG HYDROXIDE-SIMETH 200-200-20 MG/5ML PO SUSP: 30.0000 mL | Freq: Four times a day (QID) | ORAL | Status: DC | PRN

## 2020-03-05 MED ORDER — BUPIVACAINE HCL (PF) 0.5 % IJ SOLN
INTRAMUSCULAR | Status: DC | PRN
  Administered 2020-03-05: 10 mL

## 2020-03-05 MED ORDER — VANCOMYCIN HCL 1 G IV SOLR
INTRAVENOUS | Status: DC | PRN
  Administered 2020-03-05: 1000 mg via TOPICAL

## 2020-03-05 MED ORDER — PROPOFOL 10 MG/ML IV BOLUS
INTRAVENOUS | Status: DC | PRN
Start: 2020-03-05 — End: 2020-03-05
  Administered 2020-03-05: 100 mg via INTRAVENOUS

## 2020-03-05 MED ORDER — ORAL CARE MOUTH RINSE: 15.0000 mL | Freq: Once | OROMUCOSAL | Status: AC

## 2020-03-05 MED ORDER — SODIUM CHLORIDE 0.9 % IV SOLN: 250.0000 mL | INTRAVENOUS | Status: DC

## 2020-03-05 MED ORDER — BUPIVACAINE HCL (PF) 0.5 % IJ SOLN
INTRAMUSCULAR | Status: AC
  Filled 2020-03-05: qty 30

## 2020-03-05 MED ORDER — TRAZODONE HCL 100 MG PO TABS
100.0000 mg | ORAL_TABLET | Freq: Every day | ORAL | Status: DC
  Filled 2020-03-05: qty 1

## 2020-03-05 MED ORDER — FENTANYL CITRATE (PF) 250 MCG/5ML IJ SOLN
INTRAMUSCULAR | Status: AC
  Filled 2020-03-05: qty 5

## 2020-03-05 MED ORDER — CHLORHEXIDINE GLUCONATE 0.12 % MT SOLN: 15.0000 mL | Freq: Once | OROMUCOSAL | Status: AC

## 2020-03-05 MED ORDER — DEXAMETHASONE SODIUM PHOSPHATE 10 MG/ML IJ SOLN
INTRAMUSCULAR | Status: DC | PRN
  Administered 2020-03-05: 4 mg via INTRAVENOUS

## 2020-03-05 MED ORDER — KCL IN DEXTROSE-NACL 20-5-0.45 MEQ/L-%-% IV SOLN: INTRAVENOUS | Status: DC

## 2020-03-05 MED ORDER — SENNOSIDES-DOCUSATE SODIUM 8.6-50 MG PO TABS: 1.0000 | ORAL_TABLET | Freq: Every evening | ORAL | Status: DC | PRN

## 2020-03-05 MED ORDER — ACETAMINOPHEN 650 MG RE SUPP: 650.0000 mg | RECTAL | Status: DC | PRN

## 2020-03-05 MED ORDER — CEFAZOLIN SODIUM-DEXTROSE 2-4 GM/100ML-% IV SOLN: 2.0000 g | Freq: Three times a day (TID) | INTRAVENOUS | Status: DC

## 2020-03-05 MED ORDER — PANTOPRAZOLE SODIUM 40 MG PO TBEC: 40.0000 mg | DELAYED_RELEASE_TABLET | Freq: Every day | ORAL | Status: DC

## 2020-03-05 MED ORDER — OXYCODONE HCL 5 MG PO TABS: 5.0000 mg | ORAL_TABLET | ORAL | Status: DC | PRN

## 2020-03-05 MED ORDER — BACITRACIN 500 UNIT/GM EX OINT
TOPICAL_OINTMENT | CUTANEOUS | Status: DC | PRN
  Administered 2020-03-05: 1 via TOPICAL

## 2020-03-05 MED ORDER — ACETAMINOPHEN 500 MG PO TABS: 1000.0000 mg | ORAL_TABLET | Freq: Once | ORAL | Status: AC

## 2020-03-05 MED ORDER — MORPHINE SULFATE (PF) 2 MG/ML IV SOLN
1.0000 mg | INTRAVENOUS | Status: DC | PRN
Start: 2020-03-05 — End: 2020-03-05

## 2020-03-05 MED ORDER — BACITRACIN ZINC 500 UNIT/GM EX OINT
TOPICAL_OINTMENT | CUTANEOUS | Status: AC
  Filled 2020-03-05: qty 28.35

## 2020-03-05 MED ORDER — DOCUSATE SODIUM 100 MG PO CAPS: 200.0000 mg | ORAL_CAPSULE | Freq: Two times a day (BID) | ORAL | Status: DC

## 2020-03-05 MED ORDER — CEFAZOLIN SODIUM-DEXTROSE 2-4 GM/100ML-% IV SOLN
INTRAVENOUS | Status: AC
  Filled 2020-03-05: qty 100

## 2020-03-05 MED ORDER — PREDNISONE 5 MG PO TABS: 5.0000 mg | ORAL_TABLET | Freq: Every day | ORAL | Status: DC

## 2020-03-05 MED ORDER — FENTANYL CITRATE (PF) 100 MCG/2ML IJ SOLN: 25.0000 ug | INTRAMUSCULAR | Status: DC | PRN

## 2020-03-05 MED ORDER — 0.9 % SODIUM CHLORIDE (POUR BTL) OPTIME
TOPICAL | Status: DC | PRN
  Administered 2020-03-05: 1000 mL

## 2020-03-05 MED ORDER — CHLORHEXIDINE GLUCONATE 0.12 % MT SOLN
OROMUCOSAL | Status: AC
  Administered 2020-03-05: 15 mL via OROMUCOSAL
  Filled 2020-03-05: qty 15

## 2020-03-05 MED ORDER — ESTER-C 500-550 MG PO TABS: ORAL_TABLET | Freq: Every day | ORAL | Status: DC

## 2020-03-05 MED ORDER — CLONAZEPAM 0.25 MG PO TBDP: 0.2500 mg | ORAL_TABLET | Freq: Every day | ORAL | Status: DC

## 2020-03-05 MED ORDER — ONDANSETRON HCL 4 MG PO TABS: 4.0000 mg | ORAL_TABLET | Freq: Four times a day (QID) | ORAL | Status: DC | PRN

## 2020-03-05 MED ORDER — CEFAZOLIN SODIUM-DEXTROSE 2-4 GM/100ML-% IV SOLN
2.0000 g | INTRAVENOUS | Status: AC
  Administered 2020-03-05: 2 g via INTRAVENOUS

## 2020-03-05 MED ORDER — SIMVASTATIN 20 MG PO TABS: 40.0000 mg | ORAL_TABLET | Freq: Every day | ORAL | Status: DC

## 2020-03-05 MED ORDER — CHLORHEXIDINE GLUCONATE CLOTH 2 % EX PADS: 6.0000 | MEDICATED_PAD | Freq: Once | CUTANEOUS | Status: DC

## 2020-03-05 MED ORDER — MONTELUKAST SODIUM 10 MG PO TABS: 10.0000 mg | ORAL_TABLET | Freq: Every day | ORAL | Status: DC | PRN

## 2020-03-05 MED ORDER — LIDOCAINE-EPINEPHRINE 1 %-1:100000 IJ SOLN
INTRAMUSCULAR | Status: AC
  Filled 2020-03-05: qty 1

## 2020-03-05 MED ORDER — FENTANYL CITRATE (PF) 250 MCG/5ML IJ SOLN
INTRAMUSCULAR | Status: DC | PRN
  Administered 2020-03-05 (×2): 50 ug via INTRAVENOUS

## 2020-03-05 MED ORDER — LIDOCAINE 2% (20 MG/ML) 5 ML SYRINGE
INTRAMUSCULAR | Status: DC | PRN
  Administered 2020-03-05: 60 mg via INTRAVENOUS

## 2020-03-05 MED ORDER — ACETAMINOPHEN 500 MG PO TABS
ORAL_TABLET | ORAL | Status: AC
  Administered 2020-03-05: 1000 mg via ORAL
  Filled 2020-03-05: qty 2

## 2020-03-05 MED ORDER — SODIUM CHLORIDE 0.9% FLUSH: 3.0000 mL | Freq: Two times a day (BID) | INTRAVENOUS | Status: DC

## 2020-03-05 MED ORDER — PANTOPRAZOLE SODIUM 40 MG IV SOLR: 40.0000 mg | Freq: Every day | INTRAVENOUS | Status: DC

## 2020-03-05 MED ORDER — FLEET ENEMA 7-19 GM/118ML RE ENEM: 1.0000 | ENEMA | Freq: Once | RECTAL | Status: DC | PRN

## 2020-03-05 MED ORDER — ACETAMINOPHEN 325 MG PO TABS: 650.0000 mg | ORAL_TABLET | ORAL | Status: DC | PRN

## 2020-03-05 MED ORDER — MENTHOL 3 MG MT LOZG: 1.0000 | LOZENGE | OROMUCOSAL | Status: DC | PRN

## 2020-03-05 MED ORDER — PROPOFOL 10 MG/ML IV BOLUS
INTRAVENOUS | Status: AC
  Filled 2020-03-05: qty 40

## 2020-03-05 MED ORDER — HYDROCODONE-ACETAMINOPHEN 5-325 MG PO TABS: 2.0000 | ORAL_TABLET | ORAL | Status: DC | PRN

## 2020-03-05 MED ORDER — LIDOCAINE-EPINEPHRINE 1 %-1:100000 IJ SOLN
INTRAMUSCULAR | Status: DC | PRN
  Administered 2020-03-05: 10 mL

## 2020-03-05 MED ORDER — PROPRANOLOL HCL 60 MG PO TABS: 60.0000 mg | ORAL_TABLET | ORAL | Status: DC

## 2020-03-05 MED ORDER — SODIUM CHLORIDE 0.9% FLUSH: 3.0000 mL | INTRAVENOUS | Status: DC | PRN

## 2020-03-05 MED ORDER — ROCURONIUM BROMIDE 10 MG/ML (PF) SYRINGE
PREFILLED_SYRINGE | INTRAVENOUS | Status: DC | PRN
  Administered 2020-03-05: 50 mg via INTRAVENOUS
  Administered 2020-03-05: 10 mg via INTRAVENOUS

## 2020-03-05 MED ORDER — VANCOMYCIN HCL 1000 MG IV SOLR
INTRAVENOUS | Status: AC
  Filled 2020-03-05: qty 1000

## 2020-03-05 MED ORDER — ONDANSETRON HCL 4 MG/2ML IJ SOLN: 4.0000 mg | Freq: Four times a day (QID) | INTRAMUSCULAR | Status: DC | PRN

## 2020-03-05 MED ORDER — DOCUSATE SODIUM 100 MG PO CAPS: 100.0000 mg | ORAL_CAPSULE | Freq: Two times a day (BID) | ORAL | Status: DC

## 2020-03-05 MED ORDER — BISACODYL 10 MG RE SUPP: 10.0000 mg | Freq: Every day | RECTAL | Status: DC | PRN

## 2020-03-05 MED ORDER — LACTATED RINGERS IV SOLN: INTRAVENOUS | Status: DC

## 2020-03-05 MED ORDER — ADULT MULTIVITAMIN W/MINERALS CH: 1.0000 | ORAL_TABLET | Freq: Two times a day (BID) | ORAL | Status: DC

## 2020-03-05 MED ORDER — ZOLPIDEM TARTRATE 5 MG PO TABS: 5.0000 mg | ORAL_TABLET | Freq: Every evening | ORAL | Status: DC | PRN

## 2020-03-05 SURGICAL SUPPLY — 52 items
BLADE CLIPPER SURG (BLADE) ×2 IMPLANT
BNDG ADH 1X3 SHEER STRL LF (GAUZE/BANDAGES/DRESSINGS) IMPLANT
BOOT SUTURE AID YELLOW STND (SUTURE) ×2 IMPLANT
CANISTER SUCT 3000ML PPV (MISCELLANEOUS) ×2 IMPLANT
CHARGING SYSTEM VERCISE (MISCELLANEOUS) ×2
CLIP RANEY DISP (INSTRUMENTS) IMPLANT
COVER BACK TABLE 60X90IN (DRAPES) IMPLANT
COVER WAND RF STERILE (DRAPES) IMPLANT
DECANTER SPIKE VIAL GLASS SM (MISCELLANEOUS) ×4 IMPLANT
DERMABOND ADVANCED (GAUZE/BANDAGES/DRESSINGS) ×1
DERMABOND ADVANCED .7 DNX12 (GAUZE/BANDAGES/DRESSINGS) ×1 IMPLANT
DRAPE C-ARM 42X72 X-RAY (DRAPES) IMPLANT
DRAPE CAMERA CLOSED 9X96 (DRAPES) ×2 IMPLANT
DRAPE ORTHO SPLIT 77X108 STRL (DRAPES) ×2
DRAPE SURG ORHT 6 SPLT 77X108 (DRAPES) ×2 IMPLANT
DRSG OPSITE POSTOP 3X4 (GAUZE/BANDAGES/DRESSINGS) ×2 IMPLANT
DRSG OPSITE POSTOP 4X8 (GAUZE/BANDAGES/DRESSINGS) ×2 IMPLANT
DRSG TELFA 3X8 NADH (GAUZE/BANDAGES/DRESSINGS) IMPLANT
DURAPREP 26ML APPLICATOR (WOUND CARE) ×2 IMPLANT
GAUZE 4X4 16PLY RFD (DISPOSABLE) ×2 IMPLANT
GAUZE SPONGE 4X4 12PLY STRL (GAUZE/BANDAGES/DRESSINGS) IMPLANT
GLOVE BIO SURGEON STRL SZ8 (GLOVE) ×6 IMPLANT
GLOVE BIOGEL PI IND STRL 7.0 (GLOVE) ×1 IMPLANT
GLOVE BIOGEL PI IND STRL 8 (GLOVE) ×4 IMPLANT
GLOVE BIOGEL PI INDICATOR 7.0 (GLOVE) ×1
GLOVE BIOGEL PI INDICATOR 8 (GLOVE) ×4
GLOVE ECLIPSE 8.0 STRL XLNG CF (GLOVE) IMPLANT
GLOVE INDICATOR 8.0 STRL GRN (GLOVE) IMPLANT
GLOVE INDICATOR 8.5 STRL (GLOVE) ×4 IMPLANT
GOWN STRL REUS W/ TWL XL LVL3 (GOWN DISPOSABLE) ×2 IMPLANT
GOWN STRL REUS W/TWL 2XL LVL3 (GOWN DISPOSABLE) ×2 IMPLANT
GOWN STRL REUS W/TWL XL LVL3 (GOWN DISPOSABLE) ×2
KIT BASIN OR (CUSTOM PROCEDURE TRAY) ×2 IMPLANT
KIT CONTACT EXTENSION 55CMX8 (Miscellaneous) ×2 IMPLANT
KIT GENUS R16 GENERATOR (Generator) ×2 IMPLANT
KIT REMOTE CONTROL 4 VERCISE (KITS) ×2 IMPLANT
KIT REMOVER STAPLE SKIN (MISCELLANEOUS) ×2 IMPLANT
MARKER SKIN DUAL TIP RULER LAB (MISCELLANEOUS) ×2 IMPLANT
NEEDLE HYPO 25X1 1.5 SAFETY (NEEDLE) ×2 IMPLANT
NEEDLE SPNL 18GX3.5 QUINCKE PK (NEEDLE) ×2 IMPLANT
PACK LAMINECTOMY NEURO (CUSTOM PROCEDURE TRAY) ×2 IMPLANT
PAD ARMBOARD 7.5X6 YLW CONV (MISCELLANEOUS) ×6 IMPLANT
PASSER CATH 36 CODMAN DISP (NEUROSURGERY SUPPLIES) IMPLANT
STAPLER SKIN PROX WIDE 3.9 (STAPLE) ×2 IMPLANT
SUT ETHILON 3 0 PS 1 (SUTURE) IMPLANT
SUT SILK 2 0 PERMA HAND 18 BK (SUTURE) ×4 IMPLANT
SUT SILK 2 0 TIES 10X30 (SUTURE) ×4 IMPLANT
SUT VIC AB 2-0 CP2 18 (SUTURE) ×2 IMPLANT
SUT VIC AB 2-0 CT2 18 VCP726D (SUTURE) IMPLANT
SUT VIC AB 3-0 SH 8-18 (SUTURE) ×4 IMPLANT
SYSTEM CHARGING VERCISE (MISCELLANEOUS) ×1 IMPLANT
TOOL LONG TUNNEL 35 STRAW (SPINAL CORD STIMULATOR) ×2 IMPLANT

## 2020-03-05 NOTE — Progress Notes (Signed)
PT Cancellation Note  Patient Details Name: Handy Mcloud MRN: [id number] DOB: 1945/08/23   Cancelled Treatment:    Reason Eval/Treat Not Completed: PT screened, no needs identified, will sign off pt states doesn't need PT, is I and already ambulated with NT, pt stated MD to d/c shortly. Please reconsult if this changes  Ginette Otto, DPT Acute Rehabilitation Services [phone]   Lucretia Field 03/05/2020, 1:34 PM

## 2020-03-05 NOTE — Anesthesia Postprocedure Evaluation (Signed)
Anesthesia Post Note  Patient: Juan Spencer  Procedure(s) Performed: Left implantable pulse generator placement (Left Chest)     Patient location during evaluation: PACU Anesthesia Type: General Level of consciousness: awake and alert Pain management: pain level controlled Vital Signs Assessment: post-procedure vital signs reviewed and stable Respiratory status: spontaneous breathing, nonlabored ventilation and respiratory function stable Cardiovascular status: blood pressure returned to baseline and stable Postop Assessment: no apparent nausea or vomiting Anesthetic complications: no   No complications documented.  Last Vitals:  Vitals:   03/05/20 1037 03/05/20 1059  BP: 135/69 (!) 143/90  Pulse: 66 67  Resp: 16 18  Temp: 36.9 C 36.9 C  SpO2: 95% 99%    Last Pain:  Vitals:   03/05/20 1059  TempSrc: Oral  PainSc:                  Juan Spencer,Juan Spencer

## 2020-03-05 NOTE — Progress Notes (Signed)
Awake, alert, conversant.  MAEW.  Doing well.  

## 2020-03-05 NOTE — Transfer of Care (Signed)
Immediate Anesthesia Transfer of Care Note  Patient: Juan Spencer  Procedure(s) Performed: Left implantable pulse generator placement (Left Chest)  Patient Location: PACU  Anesthesia Type:General  Level of Consciousness: drowsy and patient cooperative  Airway & Oxygen Therapy: Patient Spontanous Breathing and Patient connected to nasal cannula oxygen  Post-op Assessment: Report given to RN and Post -op Vital signs reviewed and stable  Post vital signs: Reviewed and stable  Last Vitals:  Vitals Value Taken Time  BP 149/44 03/05/20 0907  Temp    Pulse 75 03/05/20 0909  Resp 17 03/05/20 0909  SpO2 100 % 03/05/20 0909  Vitals shown include unvalidated device data.  Last Pain: There were no vitals filed for this visit.       Complications: No complications documented.

## 2020-03-05 NOTE — Brief Op Note (Signed)
03/05/2020  9:13 AM  PATIENT:  Juan Spencer  74 y.o. male  PRE-OPERATIVE DIAGNOSIS:  Tremor  POST-OPERATIVE DIAGNOSIS:  Tremor  PROCEDURE:  Procedure(s): Left implantable pulse generator placement (Left)  SURGEON:  Surgeon(s) and Role:    * Kasiyah Platter, MD - Primary  PHYSICIAN ASSISTANT: McDaniel, NP  ASSISTANTS: none   ANESTHESIA:   general  EBL:  15 mL   BLOOD ADMINISTERED:none  DRAINS: none   LOCAL MEDICATIONS USED:  MARCAINE    and LIDOCAINE   SPECIMEN:  No Specimen  DISPOSITION OF SPECIMEN:  N/A  COUNTS:  YES  TOURNIQUET:  * No tourniquets in log *  DICTATION: DICTATION: Patient has implanted left vim thalamus stimulator electrode having recently completed DBS Stage I and now presents for placement of lead extensions and IPG implantation.  PROCEDURE: Patient was brought to the operating room and GETA anesthesia was induced.  Left upper chest, scalp, neck were prepped with betadine scrub and Duraprep.  Area of planned incision was infiltrated with lidocaine.  Scalp incision was made and the lead extensions were exposed. An incision was made in the left upper chest and a pocket was created.  Extension tunnel was made from scalp to pocket.  BS rechargeable IPG was placed and attached to lead extensions, which in turn were connected to cranial leads and torqued appropriately.    The IPG  was placed in the pocket.  Wounds were irrigated with vancomycin. Impedances were in range.   Incisions were closed with 2-0 Vicryl and 3-0 vicryl sutures at the pocket and 2-0 vicryl at the scalp with staples. and dressed with a sterile occlusive dressings.  Counts were correct at the end of the case.  PLAN OF CARE: Admit for overnight observation  PATIENT DISPOSITION:  PACU - hemodynamically stable.   Delay start of Pharmacological VTE agent (>24hrs) due to surgical blood loss or risk of bleeding: yes  

## 2020-03-05 NOTE — Anesthesia Procedure Notes (Signed)
Procedure Name: Intubation Date/Time: 03/05/2020 7:49 AM Performed by: Waynard Edwards, CRNA Pre-anesthesia Checklist: Patient identified, Emergency Drugs available, Suction available and Patient being monitored Patient Re-evaluated:Patient Re-evaluated prior to induction Oxygen Delivery Method: Circle system utilized Preoxygenation: Pre-oxygenation with 100% oxygen Induction Type: IV induction Ventilation: Mask ventilation without difficulty and Oral airway inserted - appropriate to patient size Laryngoscope Size: Hyacinth Meeker and 2 Grade View: Grade I Tube type: Oral Tube size: 7.5 mm Number of attempts: 1 Airway Equipment and Method: Stylet Placement Confirmation: ETT inserted through vocal cords under direct vision,  positive ETCO2 and breath sounds checked- equal and bilateral Secured at: 22 cm Tube secured with: Tape Dental Injury: Teeth and Oropharynx as per pre-operative assessment

## 2020-03-05 NOTE — Discharge Summary (Signed)
Physician Discharge Summary  Patient ID: Juan Spencer MRN: [id number] DOB/AGE: Jun 05, 1945 74 y.o.  Admit date: 03/05/2020 Discharge date: 03/05/2020  Admission Diagnoses: Tremor  Discharge Diagnoses: Tremor Active Problems:   Tremor   Discharged Condition: good  Hospital Course: The patient was admitted on 03/05/2020 and taken to the operating room where the patient underwent placement of left implantable pulse generator. The patient tolerated the procedure well and was taken to the recovery room and then to the floor in stable condition. The hospital course was routine. There were no complications. The wounds remained clean dry and intact. Pt had appropriate incisional soreness. No complaints of neurologic deficits. The patient remained afebrile with stable vital signs, and tolerated a regular diet. The patient continued to increase activities, and pain was well controlled with oral pain medications.  Consults: None  Significant Diagnostic Studies: None  Treatments: surgery: Left implantable pulse generator placement (Left)  Discharge Exam: Blood pressure (!) 143/90, pulse 67, temperature 98.5 F (36.9 C), temperature source Oral, resp. rate 18, SpO2 99 %.  Awake, A/O X 4, and conversant. Patient is doing well overall and is in good spirits. MAEW with good strength that is symmetric bilaterally.  CNs grossly intact. Dressings are clean dry intact.  Incisions are well approximated with no drainage, erythema, or edema.  Disposition: Discharge disposition: 01-Home or Self Care       Discharge Instructions    Incentive spirometry RT   Complete by: As directed      Allergies as of 03/05/2020      Reactions   Aleve [naproxen Sodium] Anaphylaxis, Hives, Swelling   Motrin [ibuprofen] Anaphylaxis, Hives, Swelling      Medication List    TAKE these medications   acetaminophen 500 MG tablet Commonly known as: TYLENOL Take 1,000 mg by mouth every 6 (six) hours as needed.    clonazePAM 0.25 MG disintegrating tablet Commonly known as: KLONOPIN Take 0.25 mg by mouth at bedtime. Not taking at this time   docusate sodium 100 MG capsule Commonly known as: COLACE Take 200 mg by mouth 2 (two) times daily. Stool softner   ESTER-C PO Take 500 mg by mouth daily.   Fish Oil 1000 MG Caps Take 2,000 mg by mouth 2 (two) times daily.   GLUCOSAMINE 1500 COMPLEX PO Take 1 tablet by mouth in the morning and at bedtime.   HYDROcodone-acetaminophen 5-325 MG tablet Commonly known as: NORCO/VICODIN Take 2 tablets by mouth every 4 (four) hours as needed for severe pain ((score 7 to 10)).   menthol-cetylpyridinium 3 MG lozenge Commonly known as: CEPACOL Take 1 lozenge (3 mg total) by mouth as needed for sore throat.   montelukast 10 MG tablet Commonly known as: SINGULAIR Take 10 mg by mouth daily as needed ([place] for allergies).   multivitamin with minerals Tabs tablet Take 1 tablet by mouth 2 (two) times daily. Mega Men 50 +   omeprazole 20 MG capsule Commonly known as: PRILOSEC Take 20 mg by mouth daily.   OVER THE COUNTER MEDICATION Take 2,000 mg by mouth daily. CBD   predniSONE 5 MG tablet Commonly known as: DELTASONE Take 5 mg by mouth daily with breakfast.   Prevagen 10 MG Caps Generic drug: Apoaequorin Take 10 mg by mouth daily.   primidone 50 MG tablet Commonly known as: MYSOLINE 11/16 (3 weeks prior to DBS surgery) Take primidone 50 mg, 2 in the AM, 2 in the afternoon, and 3 at bedtime  On 11/23  (2 weeks  prior to DBS surgery): primidone 50 mg, 1 in the AM, 1 in the afternoon, 2 at bed  On 11/30 (1 week prior to DBS surgery)   Take primidone 50 mg, 1 in the AM, 1 in the afternoon, 1 at bed  On 12/3: Take primidone 50 mg, 1 in the AM and 1 at bed  On 12/5: STOP PRIMIDONE What changed:   how much to take  how to take this  when to take this  additional instructions   propranolol 60 MG tablet Commonly known as:  INDERAL Take 60-120 mg by mouth See admin instructions. Take 120 mg in the morning and 60 mg at bedtime   sildenafil 20 MG tablet Commonly known as: REVATIO Take 100 mg by mouth daily as needed for erectile dysfunction.   simvastatin 40 MG tablet Commonly known as: ZOCOR Take 40 mg by mouth at bedtime.   tadalafil 5 MG tablet Commonly known as: CIALIS Take 5 mg by mouth at bedtime.   traZODone 100 MG tablet Commonly known as: DESYREL Take 100 mg by mouth at bedtime.        Signed: Council Mechanic, DNP, NP-C 03/05/2020, 1:54 PM

## 2020-03-05 NOTE — Discharge Instructions (Signed)
Wound Care Leave incision open to air. You may shower. Do not scrub directly on incision.  Do not put any creams, lotions, or ointments on incision. Activity Walk each and every day, increasing distance each day. No lifting greater than 5 lbs. No driving for 2 weeks; may ride as a passenger locally.  Diet Resume your normal diet.  Return to Work Will be discussed at you follow up appointment. Call Your Doctor If Any of These Occur Redness, drainage, or swelling at the wound.  Temperature greater than 101 degrees. Severe pain not relieved by pain medication. Incision starts to come apart.

## 2020-03-05 NOTE — Op Note (Signed)
03/05/2020  9:13 AM  PATIENT:  Juan Spencer  74 y.o. male  PRE-OPERATIVE DIAGNOSIS:  Tremor  POST-OPERATIVE DIAGNOSIS:  Tremor  PROCEDURE:  Procedure(s): Left implantable pulse generator placement (Left)  SURGEON:  Surgeon(s) and Role:    Maeola Harman, MD - Primary  PHYSICIAN ASSISTANT: Julien Girt, NP  ASSISTANTS: none   ANESTHESIA:   general  EBL:  15 mL   BLOOD ADMINISTERED:none  DRAINS: none   LOCAL MEDICATIONS USED:  MARCAINE    and LIDOCAINE   SPECIMEN:  No Specimen  DISPOSITION OF SPECIMEN:  N/A  COUNTS:  YES  TOURNIQUET:  * No tourniquets in log *  DICTATION: DICTATION: Patient has implanted left vim thalamus stimulator electrode having recently completed DBS Stage I and now presents for placement of lead extensions and IPG implantation.  PROCEDURE: Patient was brought to the operating room and GETA anesthesia was induced.  Left upper chest, scalp, neck were prepped with betadine scrub and Duraprep.  Area of planned incision was infiltrated with lidocaine.  Scalp incision was made and the lead extensions were exposed. An incision was made in the left upper chest and a pocket was created.  Extension tunnel was made from scalp to pocket.  BS rechargeable IPG was placed and attached to lead extensions, which in turn were connected to cranial leads and torqued appropriately.    The IPG  was placed in the pocket.  Wounds were irrigated with vancomycin. Impedances were in range.   Incisions were closed with 2-0 Vicryl and 3-0 vicryl sutures at the pocket and 2-0 vicryl at the scalp with staples. and dressed with a sterile occlusive dressings.  Counts were correct at the end of the case.  PLAN OF CARE: Admit for overnight observation  PATIENT DISPOSITION:  PACU - hemodynamically stable.   Delay start of Pharmacological VTE agent (>24hrs) due to surgical blood loss or risk of bleeding: yes

## 2020-03-05 NOTE — Interval H&P Note (Signed)
History and Physical Interval Note:  03/05/2020 7:28 AM  Juan Spencer  has presented today for surgery, with the diagnosis of Tremor.  The various methods of treatment have been discussed with the patient and family. After consideration of risks, benefits and other options for treatment, the patient has consented to  Procedure(s) with comments: Left implantable pulse generator placement (N/A) - 3C as a surgical intervention.  The patient's history has been reviewed, patient examined, no change in status, stable for surgery.  I have reviewed the patient's chart and labs.  Questions were answered to the patient's satisfaction.     Dorian Heckle

## 2020-03-05 NOTE — Plan of Care (Signed)
Patient alert and oriented, mae's well, voiding adequate amount of urine, swallowing without difficulty, no c/o pain at time of discharge. Patient discharged home with family. Script and discharged instructions given to patient. Patient and family stated understanding of instructions given. Patient has an appointment with Dr.Stern    

## 2020-03-06 ENCOUNTER — Encounter (HOSPITAL_COMMUNITY): Payer: Self-pay | Admitting: Neurosurgery

## 2020-03-12 NOTE — Progress Notes (Signed)
Assessment/Plan:    1.  Essential Tremor  -Patient is status post left VIM DBS with [employer] device on February 27, 2020.  Patient had IPG placed on March 05, 2020.  -device turned on today  -decrease primidone over the next few weeks to 100 mg daily.  Sounds like using mostly for insomnia now.  -will likely get rid of klonopin and primidone next visit but PCP may need to find something for his insomnia.  -called Dr. Rush Farmer office as pt wanted staples out today (had appt but cx due to bad weather this morning) and they said to have patient come now for staple removal.  Subjective:   Juan Spencer was seen today in follow up for essential tremor.  My previous records were reviewed prior to todays visit.  Patient had left VIM DBS with [employer] device on February 27, 2020 and IPG placed to the following week.  Patient was told he could go back on his medications postoperatively, but he ultimately called me and stated that he would hold off on that.  He is currently off of his primidone and propranolol.  Pt denies falls.  Pt denies lightheadedness, near syncope.  No hallucinations.  Mood has been good.  Current prescribed movement disorder medications: Primidone, 250 mg q hs Clonazepam 0.25 mg daily   PREVIOUS MEDICATIONS: PREVIOUS MEDICATIONS: primidone; propranolol; artane; cogentin; klonopin;carbidopa/levodopa  ALLERGIES:   Allergies  Allergen Reactions  . Aleve [Naproxen Sodium] Anaphylaxis, Hives and Swelling  . Motrin [Ibuprofen] Anaphylaxis, Hives and Swelling    CURRENT MEDICATIONS:  Outpatient Encounter Medications as of 03/25/2020  Medication Sig  . acetaminophen (TYLENOL) 500 MG tablet Take 1,000 mg by mouth every 6 (six) hours as needed.  Marland Kitchen Apoaequorin (PREVAGEN) 10 MG CAPS Take 10 mg by mouth daily.   . clonazePAM (KLONOPIN) 0.25 MG disintegrating tablet Take 0.25 mg by mouth at bedtime. Not taking at this time  . docusate sodium (COLACE) 100 MG  capsule Take 200 mg by mouth 2 (two) times daily. Stool softner  . Glucosamine-Chondroit-Vit C-Mn (GLUCOSAMINE 1500 COMPLEX PO) Take 1 tablet by mouth in the morning and at bedtime.   Marland Kitchen HYDROcodone-acetaminophen (NORCO/VICODIN) 5-325 MG tablet Take 2 tablets by mouth every 4 (four) hours as needed for severe pain ((score 7 to 10)).  Marland Kitchen menthol-cetylpyridinium (CEPACOL) 3 MG lozenge Take 1 lozenge (3 mg total) by mouth as needed for sore throat.  . montelukast (SINGULAIR) 10 MG tablet Take 10 mg by mouth daily as needed ([place] for allergies).   . Multiple Vitamin (MULTIVITAMIN WITH MINERALS) TABS tablet Take 1 tablet by mouth 2 (two) times daily. Mega Men 50 +  . Omega-3 Fatty Acids (FISH OIL) 1000 MG CAPS Take 2,000 mg by mouth 2 (two) times daily.   Marland Kitchen omeprazole (PRILOSEC) 20 MG capsule Take 20 mg by mouth daily.  Marland Kitchen OVER THE COUNTER MEDICATION Take 2,000 mg by mouth daily. CBD  . predniSONE (DELTASONE) 5 MG tablet Take 5 mg by mouth daily with breakfast.  . primidone (MYSOLINE) 50 MG tablet Take 2 tablets (100 mg total) by mouth at bedtime.  . propranolol (INDERAL) 60 MG tablet Take 60-120 mg by mouth See admin instructions. Take 120 mg in the morning and 60 mg at bedtime  . sildenafil (REVATIO) 20 MG tablet Take 100 mg by mouth daily as needed for erectile dysfunction.  . simvastatin (ZOCOR) 40 MG tablet Take 40 mg by mouth at bedtime.   . tadalafil (CIALIS)  5 MG tablet Take 5 mg by mouth at bedtime.  . traZODone (DESYREL) 100 MG tablet Take 100 mg by mouth at bedtime.   . Vitamin Mixture (ESTER-C PO) Take 500 mg by mouth daily.   . [DISCONTINUED] primidone (MYSOLINE) 50 MG tablet 11/16 (3 weeks prior to DBS surgery) Take primidone 50 mg, 2 in the AM, 2 in the afternoon, and 3 at bedtime  On 11/23  (2 weeks prior to DBS surgery): primidone 50 mg, 1 in the AM, 1 in the afternoon, 2 at bed  On 11/30 (1 week prior to DBS surgery)   Take primidone 50 mg, 1 in the AM, 1 in the afternoon,  1 at bed  On 12/3: Take primidone 50 mg, 1 in the AM and 1 at bed  On 12/5: STOP PRIMIDONE (Patient taking differently: Take 100 mg by mouth See admin instructions. Normally take 50 mg in the morning 50 mg at noon and 100 mg at bedtime. Patient is being Weaned iff   11/16 (3 weeks prior to DBS surgery) Take primidone 50 mg, 2 in the AM, 2 in the afternoon, and 3 at bedtime  On 11/23  (2 weeks prior to DBS surgery): primidone 50 mg, 1 in the AM, 1 in the afternoon, 2 at bed  On 11/30 (1 week prior to DBS surgery)   Take primidone 50 mg, 1 in the AM, 1 in the afternoon, 1 at bed  On 12/3: Take primidone 50 mg, 1 in the AM and 1 at bed  On 12/5: STOP PRIMIDONE)   No facility-administered encounter medications on file as of 03/25/2020.     Objective:    PHYSICAL EXAMINATION:    VITALS:   Vitals:   03/25/20 1206  BP: 120/84  Pulse: 80  Resp: 20  SpO2: 97%  Weight: 185 lb (83.9 kg)  Height: 5' 6.5" (1.689 m)    GEN:  The patient appears stated age and is in NAD. HEENT:  Normocephalic.  Scalp incision is well-healed, without erythema or drainage..  The mucous membranes are moist. The superficial temporal arteries are without ropiness or tenderness. CV:  RRR Lungs:  CTAB Neck/HEME:  There are no carotid bruits bilaterally. Skin: Chest incision is well-healed.  Neurological examination:  Orientation: The patient is alert and oriented x3. Cranial nerves: There is good facial symmetry. The speech is fluent and clear. Soft palate rises symmetrically and there is no tongue deviation. Hearing is intact to conversational tone. Sensation: Sensation is intact to light touch throughout Motor: Strength is at least antigravity x4.  Movement examination: Tone: There is normal tone in the UE/LE Abnormal movements: Prior to programming, there was moderate tremor in the right upper extremity, worse when the arm is held in the wing beating position.  Post programming, there was almost  no tremor.  He was able to write his name post programming. Coordination:  There is no decremation with RAM's Gait and Station: The patient has difficulty arising out of a deep-seated chair without the use of the hands. The patient's stride length is good I have reviewed and interpreted the following labs independently   Chemistry      Component Value Date/Time   NA 139 02/27/2020 0639   K 4.2 02/27/2020 0639   CL 100 02/27/2020 0639   CO2 28 02/27/2020 0639   BUN 15 02/27/2020 0639   CREATININE 0.90 02/27/2020 0639      Component Value Date/Time   CALCIUM 9.4 02/27/2020 0639   ALKPHOS  47 08/19/2010 1407   AST 73 (H) 08/19/2010 1407   ALT 78 (H) 08/19/2010 1407   BILITOT 0.5 08/19/2010 1407      Lab Results  Component Value Date   WBC 6.7 02/27/2020   HGB 14.3 02/27/2020   HCT 41.5 02/27/2020   MCV 96.1 02/27/2020   PLT 144 (L) 02/27/2020   No results found for: TSH   Chemistry      Component Value Date/Time   NA 139 02/27/2020 0639   K 4.2 02/27/2020 0639   CL 100 02/27/2020 0639   CO2 28 02/27/2020 0639   BUN 15 02/27/2020 0639   CREATININE 0.90 02/27/2020 0639      Component Value Date/Time   CALCIUM 9.4 02/27/2020 0639   ALKPHOS 47 08/19/2010 1407   AST 73 (H) 08/19/2010 1407   ALT 78 (H) 08/19/2010 1407   BILITOT 0.5 08/19/2010 1407         Total time spent on today's visit was 20 minutes, including both face-to-face time and nonface-to-face time.  Time included that spent on review of records (prior notes available to me/labs/imaging if pertinent), discussing treatment and goals, answering patient's questions and coordinating care.  This did not include the DBS time, which was the large majority of the visit.  Cc:  Pomposini, Rande Brunt, MD

## 2020-03-15 ENCOUNTER — Other Ambulatory Visit: Payer: Self-pay | Admitting: Neurology

## 2020-03-25 ENCOUNTER — Ambulatory Visit (INDEPENDENT_AMBULATORY_CARE_PROVIDER_SITE_OTHER): Payer: Medicare Other | Admitting: Neurology

## 2020-03-25 ENCOUNTER — Other Ambulatory Visit: Payer: Self-pay

## 2020-03-25 ENCOUNTER — Encounter: Payer: Self-pay | Admitting: Neurology

## 2020-03-25 VITALS — BP 120/84 | HR 80 | Resp 20 | Ht 66.5 in | Wt 185.0 lb

## 2020-03-25 DIAGNOSIS — G250 Essential tremor: Secondary | ICD-10-CM

## 2020-03-25 MED ORDER — PRIMIDONE 50 MG PO TABS: 100.0000 mg | ORAL_TABLET | Freq: Every day | ORAL | 1 refills | Status: DC

## 2020-03-25 NOTE — Procedures (Signed)
DBS Programming was performed.    Manufacturer of DBS device: [employer] with bluetooth capability  Total time spent programming was 55 minutes.  Device was turned on.  Soft start was confirmed to be on.  Impedences were checked and were within normal limits.  Battery was checked and needed charging (shown how to do this).  Final settings were as follows:    Active Contact Amplitude (mA) PW (ms) Frequency (hz) Side Effects  Left Brain       03/25/20  1-C+ 2.0 90 159 lightheaded   5/6/7(33%each)-C+ 2.0 90 159    8-C+ 2.0 90 159   Program 1 final and active 2/3/4(33%each)-5/6/7(33% each)+ 2.2 90 159   Program 2 final 2/3/4(33% each)-C+ 2.0 60 159                                      Right Brain       n/a

## 2020-03-25 NOTE — Patient Instructions (Addendum)
1.  STOP primidone 250 mg at bedtime 2.  Start primidone 50 mg, 4 tablets at bed x 4 nights, then 3 tablets at bed for 4 nights, then 2 tablets at bedtime until you see me next visit

## 2020-04-02 NOTE — Progress Notes (Deleted)
Assessment/Plan:    1.  Essential Tremor  -Patient is status post left VIM DBS with [employer] device on February 27, 2020.  Patient had IPG placed on March 05, 2020.  -Postoperative video done today.  -Will wean off primidone altogether.  Weaning schedule given.  -Discontinue clonazepam (patient only using it for sleep now and only on 1.25 mg).  2.  Knee pain  -Plans to have his knee replaced in the near future.  Reminded him that DBS needs to be turned off.  Patient knows how to do that. Subjective:   Juan Spencer was seen today in follow up for essential tremor.  My previous records were reviewed prior to todays visit.  Patient had left VIM DBS with [employer] device on February 27, 2020 and IPG placed to the following week.  Patients device was activated last visit and he states that ***.  Had his staples removed after I saw him last visit.  Reports that he is really doing quite well.  Current prescribed movement disorder medications: Primidone, 100 mg nightly Clonazepam 0.25 mg daily   PREVIOUS MEDICATIONS: PREVIOUS MEDICATIONS: primidone; propranolol; artane; cogentin; klonopin;carbidopa/levodopa  ALLERGIES:   Allergies  Allergen Reactions  . Aleve [Naproxen Sodium] Anaphylaxis, Hives and Swelling  . Motrin [Ibuprofen] Anaphylaxis, Hives and Swelling    CURRENT MEDICATIONS:  Outpatient Encounter Medications as of 04/08/2020  Medication Sig  . acetaminophen (TYLENOL) 500 MG tablet Take 1,000 mg by mouth every 6 (six) hours as needed.  Marland Kitchen Apoaequorin (PREVAGEN) 10 MG CAPS Take 10 mg by mouth daily.   . clonazePAM (KLONOPIN) 0.25 MG disintegrating tablet Take 0.25 mg by mouth at bedtime. Not taking at this time  . docusate sodium (COLACE) 100 MG capsule Take 200 mg by mouth 2 (two) times daily. Stool softner  . Glucosamine-Chondroit-Vit C-Mn (GLUCOSAMINE 1500 COMPLEX PO) Take 1 tablet by mouth in the morning and at bedtime.   Marland Kitchen HYDROcodone-acetaminophen  (NORCO/VICODIN) 5-325 MG tablet Take 2 tablets by mouth every 4 (four) hours as needed for severe pain ((score 7 to 10)).  Marland Kitchen menthol-cetylpyridinium (CEPACOL) 3 MG lozenge Take 1 lozenge (3 mg total) by mouth as needed for sore throat.  . montelukast (SINGULAIR) 10 MG tablet Take 10 mg by mouth daily as needed ([place] for allergies).   . Multiple Vitamin (MULTIVITAMIN WITH MINERALS) TABS tablet Take 1 tablet by mouth 2 (two) times daily. Mega Men 50 +  . Omega-3 Fatty Acids (FISH OIL) 1000 MG CAPS Take 2,000 mg by mouth 2 (two) times daily.   Marland Kitchen omeprazole (PRILOSEC) 20 MG capsule Take 20 mg by mouth daily.  Marland Kitchen OVER THE COUNTER MEDICATION Take 2,000 mg by mouth daily. CBD  . predniSONE (DELTASONE) 5 MG tablet Take 5 mg by mouth daily with breakfast.  . primidone (MYSOLINE) 50 MG tablet Take 2 tablets (100 mg total) by mouth at bedtime.  . propranolol (INDERAL) 60 MG tablet Take 60-120 mg by mouth See admin instructions. Take 120 mg in the morning and 60 mg at bedtime  . sildenafil (REVATIO) 20 MG tablet Take 100 mg by mouth daily as needed for erectile dysfunction.  . simvastatin (ZOCOR) 40 MG tablet Take 40 mg by mouth at bedtime.   . tadalafil (CIALIS) 5 MG tablet Take 5 mg by mouth at bedtime.  . traZODone (DESYREL) 100 MG tablet Take 100 mg by mouth at bedtime.   . Vitamin Mixture (ESTER-C PO) Take 500 mg by mouth daily.  No facility-administered encounter medications on file as of 04/08/2020.     Objective:    PHYSICAL EXAMINATION:    VITALS:   There were no vitals filed for this visit.  GEN:  The patient appears stated age and is in NAD. HEENT:  Normocephalic.  Scalp incision is well-healed, without erythema or drainage..  The mucous membranes are moist. The superficial temporal arteries are without ropiness or tenderness. CV:  RRR Lungs:  CTAB Neck/HEME:  There are no carotid bruits bilaterally. Skin: Chest incision is well-healed.  Neurological  examination:  Orientation: The patient is alert and oriented x3. Cranial nerves: There is good facial symmetry. The speech is fluent and clear. Soft palate rises symmetrically and there is no tongue deviation. Hearing is intact to conversational tone. Sensation: Sensation is intact to light touch throughout Motor: Strength is at least antigravity x4.  Movement examination: Tone: There is normal tone in the UE/LE Abnormal movements: Prior to programming, there was moderate tremor in the right upper extremity, worse when the arm is held in the wing beating position.  Post programming, there was almost no tremor.  He was able to write his name post programming. Coordination:  There is no decremation with RAM's Gait and Station: The patient has difficulty arising out of a deep-seated chair without the use of the hands. The patient's stride length is good I have reviewed and interpreted the following labs independently   Chemistry      Component Value Date/Time   NA 139 02/27/2020 0639   K 4.2 02/27/2020 0639   CL 100 02/27/2020 0639   CO2 28 02/27/2020 0639   BUN 15 02/27/2020 0639   CREATININE 0.90 02/27/2020 0639      Component Value Date/Time   CALCIUM 9.4 02/27/2020 0639   ALKPHOS 47 08/19/2010 1407   AST 73 (H) 08/19/2010 1407   ALT 78 (H) 08/19/2010 1407   BILITOT 0.5 08/19/2010 1407      Lab Results  Component Value Date   WBC 6.7 02/27/2020   HGB 14.3 02/27/2020   HCT 41.5 02/27/2020   MCV 96.1 02/27/2020   PLT 144 (L) 02/27/2020   No results found for: TSH   Chemistry      Component Value Date/Time   NA 139 02/27/2020 0639   K 4.2 02/27/2020 0639   CL 100 02/27/2020 0639   CO2 28 02/27/2020 0639   BUN 15 02/27/2020 0639   CREATININE 0.90 02/27/2020 0639      Component Value Date/Time   CALCIUM 9.4 02/27/2020 0639   ALKPHOS 47 08/19/2010 1407   AST 73 (H) 08/19/2010 1407   ALT 78 (H) 08/19/2010 1407   BILITOT 0.5 08/19/2010 1407         Total time  spent on today's visit was *** minutes, including both face-to-face time and nonface-to-face time.  Time included that spent on review of records (prior notes available to me/labs/imaging if pertinent), discussing treatment and goals, answering patient's questions and coordinating care.  This did not include the DBS time, which was the large majority of the visit.  Cc:  Pomposini, Rande Brunt, MD

## 2020-04-03 NOTE — Progress Notes (Signed)
Assessment/Plan:    1.  Essential Tremor  -Patient is status post left VIM DBS with [employer] device on February 27, 2020.  Patient had IPG placed on March 05, 2020.  His device was actually off when he came into the office today (patient accidentally turned it off).  We turned it back on and reprogrammed the device.  -Postoperative video done today.  -Will wean off primidone altogether.  Weaning schedule given.  -Discontinue clonazepam (patient only using it for sleep now and only on .25 mg).  Patient nervous about doing that because he is using it for insomnia.  Told him that if primary care would like to continue this for insomnia, then his primary care physician will need to prescribe it, as I no longer need it for tremor.  Patient has an appointment with primary care upcoming.  -Patient does currently have some type of viral illness and is currently on albuterol and prednisone.  Told him that this certainly could increase shaking.  However, his right hand was basically tremor free when he left the office today.  2.  Knee pain  -Plans to have his knee replaced in the near future.  Reminded him that DBS needs to be turned off.  Patient knows how to do that.  We reviewed how to use his remote today, including making sure that he is charging Foley (it was clear that he had not gotten a full charge on his device until yesterday). Subjective:   Juan Spencer was seen today in follow up for essential tremor.  My previous records were reviewed prior to todays visit.  Patient had left VIM DBS with [employer] device on February 27, 2020 and IPG placed to the following week.  Patients device was activated last visit and he states that he initially did really well.  Had his staples removed after I saw him last visit.  Reports that he is really having a lot of tremor now.  States that he charged device Monday, but Tuesday he started to note some tremor.  Yesterday, he was writing bills  and "I was almost as bad as I was before."  He admits that he got sick and went to [facility] on Sunday.  He was given prednisone and they "gave me a puffer."  He states that he initially had some loss of balance but that went away fast  Current prescribed movement disorder medications: Primidone, 100 mg nightly Clonazepam 0.25 mg daily   PREVIOUS MEDICATIONS: PREVIOUS MEDICATIONS: primidone; propranolol; artane; cogentin; klonopin;carbidopa/levodopa  ALLERGIES:   Allergies  Allergen Reactions  . Aleve [Naproxen Sodium] Anaphylaxis, Hives and Swelling  . Motrin [Ibuprofen] Anaphylaxis, Hives and Swelling    CURRENT MEDICATIONS:  Outpatient Encounter Medications as of 04/05/2020  Medication Sig  . acetaminophen (TYLENOL) 500 MG tablet Take 1,000 mg by mouth every 6 (six) hours as needed.  Marland Kitchen albuterol (VENTOLIN HFA) 108 (90 Base) MCG/ACT inhaler Inhale 2 puffs into the lungs every 4 (four) hours as needed for wheezing or shortness of breath.  . Apoaequorin (PREVAGEN) 10 MG CAPS Take 10 mg by mouth daily.   Marland Kitchen azithromycin (ZITHROMAX) 250 MG tablet Take by mouth. Take 2 tabs by mouth today and 1 tablet qd for 4 days  . benzonatate (TESSALON) 200 MG capsule Take 200 mg by mouth 3 (three) times daily as needed for cough.  . Cholecalciferol (VITAMIN D) 50 MCG (2000 UT) CAPS Take by mouth daily.  . clonazePAM (KLONOPIN) 0.25 MG disintegrating tablet  Take 0.25 mg by mouth at bedtime. Not taking at this time  . docusate sodium (COLACE) 100 MG capsule Take 200 mg by mouth 2 (two) times daily. Stool softner  . Echinacea-Vitamin C 250-250 MG CAPS Take by mouth.  . Glucosamine-Chondroit-Vit C-Mn (GLUCOSAMINE 1500 COMPLEX PO) Take 1 tablet by mouth in the morning and at bedtime.   Marland Kitchen HYDROcodone-acetaminophen (NORCO/VICODIN) 5-325 MG tablet Take 2 tablets by mouth every 4 (four) hours as needed for severe pain ((score 7 to 10)).  Marland Kitchen montelukast (SINGULAIR) 10 MG tablet Take 10 mg by mouth daily as needed  (In [place] for allergies).   . Multiple Vitamin (MULTIVITAMIN WITH MINERALS) TABS tablet Take 1 tablet by mouth 2 (two) times daily. Mega Men 50 +  . Omega-3 Fatty Acids (FISH OIL) 1000 MG CAPS Take 2,000 mg by mouth 2 (two) times daily.   Marland Kitchen omeprazole (PRILOSEC) 20 MG capsule Take 20 mg by mouth daily.  Marland Kitchen OVER THE COUNTER MEDICATION Take 2,000 mg by mouth daily. CBD  . Phenylephrine-DM-GG-APAP (MUCINEX SINUS-MAX PRESS/PN/CGH) 5-10-200-325 MG CAPS Take by mouth as needed.  . predniSONE (DELTASONE) 5 MG tablet Take 5 mg by mouth daily with breakfast.  . sildenafil (REVATIO) 20 MG tablet Take 100 mg by mouth daily as needed for erectile dysfunction.  . simvastatin (ZOCOR) 40 MG tablet Take 40 mg by mouth at bedtime.   . tadalafil (CIALIS) 5 MG tablet Take 5 mg by mouth at bedtime.  . traZODone (DESYREL) 100 MG tablet Take 100 mg by mouth at bedtime.   . Vitamin Mixture (ESTER-C PO) Take 500 mg by mouth daily.   . [DISCONTINUED] primidone (MYSOLINE) 50 MG tablet Take 2 tablets (100 mg total) by mouth at bedtime.  . [DISCONTINUED] menthol-cetylpyridinium (CEPACOL) 3 MG lozenge Take 1 lozenge (3 mg total) by mouth as needed for sore throat. (Patient not taking: Reported on 04/05/2020)  . [DISCONTINUED] propranolol (INDERAL) 60 MG tablet Take 60-120 mg by mouth See admin instructions. Take 120 mg in the morning and 60 mg at bedtime (Patient not taking: Reported on 04/05/2020)   No facility-administered encounter medications on file as of 04/05/2020.     Objective:    PHYSICAL EXAMINATION:    VITALS:   Vitals:   04/05/20 1311  BP: 124/80  Pulse: 80  SpO2: 98%  Weight: 181 lb (82.1 kg)  Height: 5' 6.5" (1.689 m)    GEN:  The patient appears stated age and is in NAD. HEENT:  Normocephalic. The mucous membranes are moist. The superficial temporal arteries are without ropiness or tenderness. CV:  RRR Lungs:  CTAB.  Patient does have a wet cough. Neck/HEME:  There are no carotid bruits  bilaterally.   Neurological examination:  Orientation: The patient is alert and oriented x3. Cranial nerves: There is good facial symmetry. The speech is fluent and clear. Soft palate rises symmetrically and there is no tongue deviation. Hearing is intact to conversational tone. Sensation: Sensation is intact to light touch throughout Motor: Strength is at least antigravity x4.  Movement examination: Tone: There is normal tone in the UE/LE Abnormal movements: Prior to programming, there was mild to moderate tremor in the right hand and left hand.  Post programming, there was no tremor on the right.  The left remained the same. Coordination:  There is no decremation with RAM's Gait and Station: The patient has difficulty arising out of a deep-seated chair without the use of the hands. The patient's stride length is good I  have reviewed and interpreted the following labs independently   Chemistry      Component Value Date/Time   NA 139 02/27/2020 0639   K 4.2 02/27/2020 0639   CL 100 02/27/2020 0639   CO2 28 02/27/2020 0639   BUN 15 02/27/2020 0639   CREATININE 0.90 02/27/2020 0639      Component Value Date/Time   CALCIUM 9.4 02/27/2020 0639   ALKPHOS 47 08/19/2010 1407   AST 73 (H) 08/19/2010 1407   ALT 78 (H) 08/19/2010 1407   BILITOT 0.5 08/19/2010 1407      Lab Results  Component Value Date   WBC 6.7 02/27/2020   HGB 14.3 02/27/2020   HCT 41.5 02/27/2020   MCV 96.1 02/27/2020   PLT 144 (L) 02/27/2020   No results found for: TSH   Chemistry      Component Value Date/Time   NA 139 02/27/2020 0639   K 4.2 02/27/2020 0639   CL 100 02/27/2020 0639   CO2 28 02/27/2020 0639   BUN 15 02/27/2020 0639   CREATININE 0.90 02/27/2020 0639      Component Value Date/Time   CALCIUM 9.4 02/27/2020 0639   ALKPHOS 47 08/19/2010 1407   AST 73 (H) 08/19/2010 1407   ALT 78 (H) 08/19/2010 1407   BILITOT 0.5 08/19/2010 1407         Total time spent on today's visit was 30  minutes, including both face-to-face time and nonface-to-face time.  Time included that spent on review of records (prior notes available to me/labs/imaging if pertinent), discussing treatment and goals, answering patient's questions and coordinating care.  This did not include the DBS time, which was the large majority of the visit.  Cc:  Pomposini, Rande Brunt, MD

## 2020-04-04 ENCOUNTER — Telehealth: Payer: Self-pay | Admitting: Neurology

## 2020-04-04 NOTE — Telephone Encounter (Signed)
I will see him tomorrow but is his DBS ON?  Has he charged it?

## 2020-04-04 NOTE — Telephone Encounter (Signed)
Per patient his tremors are just as bad as they were before the surgery. He has appt with Tat on 04-05-20.

## 2020-04-05 ENCOUNTER — Ambulatory Visit (INDEPENDENT_AMBULATORY_CARE_PROVIDER_SITE_OTHER): Payer: Medicare Other | Admitting: Neurology

## 2020-04-05 ENCOUNTER — Other Ambulatory Visit: Payer: Self-pay

## 2020-04-05 ENCOUNTER — Encounter: Payer: Self-pay | Admitting: Neurology

## 2020-04-05 DIAGNOSIS — G250 Essential tremor: Secondary | ICD-10-CM | POA: Diagnosis not present

## 2020-04-05 NOTE — Patient Instructions (Signed)
1.  Decrease primidone 50 mg, 1 tablet at night for a week and then stop it! 2.  I don't need the klonopin any more but you can ask your PCP if he would like to RX for sleep or if he has alternative recommendation

## 2020-04-05 NOTE — Procedures (Signed)
DBS Programming was performed.    Manufacturer of DBS device: [employer] with bluetooth capability  Total time spent programming was 40 minutes.  Device was found to be OFF.  Device turned ON.  Soft start was confirmed to be on.  Impedences were checked and were within normal limits.  Battery was checked and needed charging (shown how to do this).  Final settings were as follows:    Active Contact Amplitude (mA) PW (ms) Frequency (hz) Side Effects  Left Brain       03/25/20  1-C+ 2.0 90 159 lightheaded   5/6/7(33%each)-C+ 2.0 90 159    8-C+ 2.0 90 159   Program 1 final and active 2/3/4(33%each)-5/6/7(33% each)+ 2.2 90 159   Program 2 final 2/3/4(33% each)-C+ 2.0 60 159   04/05/20 2/3/4(33% each)-5/6/7(33%each)+ 2.4 90 159                               Right Brain       n/a

## 2020-04-05 NOTE — Telephone Encounter (Signed)
Will address at office visit today with Dr Tat.

## 2020-04-08 ENCOUNTER — Encounter: Payer: Medicare Other | Admitting: Neurology

## 2020-04-23 HISTORY — PX: REPLACEMENT TOTAL KNEE: SUR1224

## 2020-06-11 ENCOUNTER — Telehealth: Payer: Self-pay | Admitting: Neurology

## 2020-06-11 NOTE — Telephone Encounter (Signed)
Please call patient and find out if he has checked to see if it is ON.  Also, he has the ability to adjust the device somewhat.  Has he tried to turn it up a little?

## 2020-06-11 NOTE — Telephone Encounter (Signed)
Patient states his tremors have been getting worse for the past two to three weeks.   Spoke with patient and told him that Dr Tat states he can adjust the DBS a little. He states he is going to try to adjust it while we are on the phone.   He started asking questions. Advised him to hold. Spoke with Dr tat who suggested the patient be scheduled for an office appointment on Thursday.   Patient agreeable and voiced understanding.

## 2020-06-11 NOTE — Telephone Encounter (Signed)
Patient called in wanting to check on what he should do about his tremors. He stated he is afraid to mess with the stimulator without someone letting him know what to do.

## 2020-06-11 NOTE — Telephone Encounter (Signed)
FYI

## 2020-06-12 NOTE — Procedures (Signed)
DBS Programming was performed.  Pt reported tremor over last few weeks.  Reported that he checked device and was ON at home (previously off).  Asked patient on the telephone to just use patient programmer to turn up the device a little bit, but he could not figure out how to do it and was brought in today for further management of device and patient education.  Reports today that tremor isn't bad but noting more tremor with eating.  Has had knee surgery since last visit and in lots of pain.  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  Manufacturer of DBS device: [employer] with bluetooth capability  Total time spent programming and in patient education on programming was 39 minutes.  Device was found to be on.    Soft start was confirmed to be on.  Impedences were checked and were within normal limits.  Battery was checked and needed charging (shown how to do this).  Final settings were as follows:    Active Contact Amplitude (mA) PW (ms) Frequency (hz) Side Effects  Left Brain       03/25/20  1-C+ 2.0 90 159 lightheaded   5/6/7(33%each)-C+ 2.0 90 159    8-C+ 2.0 90 159   Program 1 final and active 2/3/4(33%each)-5/6/7(33% each)+ 2.2 90 159   Program 2 final 2/3/4(33% each)-C+ 2.0 60 159   04/05/20 2/3/4(33% each)-5/6/7(33%each)+ 2.4 90 159   06/13/20 2/3/4(33% each)-5/6/7(33%each)+ 2.7 (1.4-3.0) 90 159                        Right Brain       n/a

## 2020-06-13 ENCOUNTER — Encounter: Payer: Self-pay | Admitting: Neurology

## 2020-06-13 ENCOUNTER — Other Ambulatory Visit: Payer: Self-pay

## 2020-06-13 ENCOUNTER — Ambulatory Visit (INDEPENDENT_AMBULATORY_CARE_PROVIDER_SITE_OTHER): Payer: Medicare Other | Admitting: Neurology

## 2020-06-13 DIAGNOSIS — G250 Essential tremor: Secondary | ICD-10-CM

## 2020-07-09 ENCOUNTER — Other Ambulatory Visit: Payer: Self-pay | Admitting: Neurology

## 2020-08-24 ENCOUNTER — Other Ambulatory Visit: Payer: Self-pay | Admitting: Neurology

## 2021-01-03 ENCOUNTER — Telehealth: Payer: Self-pay | Admitting: Neurology

## 2021-01-03 ENCOUNTER — Encounter: Payer: Medicare Other | Admitting: Neurology

## 2021-01-03 NOTE — Telephone Encounter (Signed)
Reached out to [employer] to get a rep to call patient they are trying to reach him now but there appears to be a missed connection. If they can not reach him we can see patient on Monday at 1:45 not a scheduled appointment but just to see the rep who will be here with another patient as well

## 2021-01-03 NOTE — Telephone Encounter (Signed)
Spoke with the patient and gave him [employer] number. I also let him know the rep will be here on Monday at 1:45 if he cannot get in touch with them.

## 2021-01-03 NOTE — Telephone Encounter (Signed)
Patient also mentioned his DBS battery is taking a long time to charge. Last night he charged it for 3 hours and it still was not full.

## 2021-01-03 NOTE — Telephone Encounter (Signed)
Patient came by the office today to check in for a 9 month follow up with Dr. Arbutus Leas. However, the appointment was previously cancelled and not rescheduled.  Patient stated he has been having trouble with his equilibrium for about 5-6 weeks but wanted to wait to address it at his appointment today.  Routing to clinical for guidance on scheduling follow up for patient.

## 2021-06-11 NOTE — Progress Notes (Signed)
? ? ?Assessment/Plan:  ? ? ?1.  Essential Tremor ? -Patient is status post left VIM DBS with [employer] device on February 27, 2020.  Patient had IPG placed on March 05, 2020.  His device was actually off when he came into the office today (patient accidentally turned it off).  We turned it back on and reprogrammed the device. ? ?2.  Gait instability. ? -Patient has perception that he is dragging the right leg.  I did not really notice that on examination today.  I did not notice any foot drop.  Did not notice any ataxia.  He will let me know if that gets worse and we can investigate further. ? ? ? ?Subjective:  ? ?Juan Spencer was seen today in follow up for essential tremor.  My previous records were reviewed prior to todays visit.  Patient last seen 1 year ago.  At that point in time, I told him that I could not prescribe his clonazepam any longer, primarily because I was not using it for tremor.  I also weaned him off of the primidone.  PDMP is reviewed.  It looks like he stayed on the clonazepam by primary care for a few more months by neurology in [place] (Dr. Vernice Jefferson) and is off of that now.  He did have knee replacement done since our last visit.  This was done at the end of last year.  Patient does state that he seems to be a little bit more off balance.  He seems to be noticing almost a marching step with the right foot.  He is not dizzy and has no back pain. ? ? ? ?PREVIOUS MEDICATIONS: PREVIOUS MEDICATIONS: primidone; propranolol; artane; cogentin; klonopin; carbidopa/levodopa ? ?ALLERGIES:   ?Allergies  ?Allergen Reactions  ? Aleve [Naproxen Sodium] Anaphylaxis, Hives and Swelling  ? Motrin [Ibuprofen] Anaphylaxis, Hives and Swelling  ? ? ?CURRENT MEDICATIONS:  ?Outpatient Encounter Medications as of 06/13/2021  ?Medication Sig  ? acetaminophen (TYLENOL) 500 MG tablet Take 1,000 mg by mouth every 6 (six) hours as needed.  ? Apoaequorin (PREVAGEN) 10 MG CAPS Take 10 mg by mouth daily.   ?  Cholecalciferol (VITAMIN D) 50 MCG (2000 UT) CAPS Take by mouth daily.  ? clonazePAM (KLONOPIN) 0.25 MG disintegrating tablet Take 0.25 mg by mouth at bedtime. Not taking at this time  ? docusate sodium (COLACE) 100 MG capsule Take 200 mg by mouth 2 (two) times daily. Stool softner  ? Echinacea-Vitamin C 250-250 MG CAPS Take by mouth.  ? Glucosamine-Chondroit-Vit C-Mn (GLUCOSAMINE 1500 COMPLEX PO) Take 1 tablet by mouth in the morning and at bedtime.   ? HYDROcodone-acetaminophen (NORCO/VICODIN) 5-325 MG tablet Take 2 tablets by mouth every 4 (four) hours as needed for severe pain ((score 7 to 10)).  ? montelukast (SINGULAIR) 10 MG tablet Take 10 mg by mouth daily as needed (In [place] for allergies).   ? Multiple Vitamin (MULTIVITAMIN WITH MINERALS) TABS tablet Take 1 tablet by mouth 2 (two) times daily. Mega Men 50 +  ? Omega-3 Fatty Acids (FISH OIL) 1000 MG CAPS Take 2,000 mg by mouth 2 (two) times daily.   ? omeprazole (PRILOSEC) 20 MG capsule Take 20 mg by mouth daily.  ? OVER THE COUNTER MEDICATION Take 2,000 mg by mouth daily. CBD  ? predniSONE (DELTASONE) 5 MG tablet Take 5 mg by mouth daily with breakfast.  ? sildenafil (REVATIO) 20 MG tablet Take 100 mg by mouth daily as needed for erectile dysfunction.  ? simvastatin (  ZOCOR) 40 MG tablet Take 40 mg by mouth at bedtime.   ? tadalafil (CIALIS) 5 MG tablet Take 5 mg by mouth at bedtime.  ? traZODone (DESYREL) 100 MG tablet Take 100 mg by mouth at bedtime.   ? Vitamin Mixture (ESTER-C PO) Take 500 mg by mouth daily.   ? ?No facility-administered encounter medications on file as of 06/13/2021.  ? ? ? ?Objective:  ? ? ?PHYSICAL EXAMINATION:   ? ?VITALS:   ?Vitals:  ? 06/13/21 1103  ?BP: 122/80  ?Pulse: 64  ?SpO2: 96%  ?Weight: 187 lb 12.8 oz (85.2 kg)  ?Height: 5\' 6"  (1.676 m)  ? ? ? ?GEN:  The patient appears stated age and is in NAD. ?HEENT:  Normocephalic. The mucous membranes are moist. The superficial temporal arteries are without ropiness or  tenderness. ?CV:  RRR ?Lungs:  CTAB.   ?Neck/HEME:  There are no carotid bruits bilaterally. ? ? ?Neurological examination: ? ?Orientation: The patient is alert and oriented x3. ?Cranial nerves: There is good facial symmetry. extraocular muscles are intact.  The speech is fluent and clear. Soft palate rises symmetrically and there is no tongue deviation. Hearing is intact to conversational tone. ?Sensation: Sensation is intact to light touch throughout ?Motor: Strength is at least antigravity x4. ? ?Movement examination: ?Tone: There is normal tone in the UE/LE ?Abnormal movements: No postural or intention tremor is noted.  No rest tremor. ?Coordination:  There is no decremation with RAM's ?Gait and Station: The patient has difficulty arising out of a deep-seated chair without the use of the hands. The patient's stride length is good with good arm swing.  No foot drop. ?I have reviewed and interpreted the following labs independently ?  Chemistry   ?   ?Component Value Date/Time  ? NA 139 02/27/2020 0639  ? K 4.2 02/27/2020 0639  ? CL 100 02/27/2020 0639  ? CO2 28 02/27/2020 0639  ? BUN 15 02/27/2020 0639  ? CREATININE 0.90 02/27/2020 0639  ?    ?Component Value Date/Time  ? CALCIUM 9.4 02/27/2020 0639  ? ALKPHOS 47 08/19/2010 1407  ? AST 73 (H) 08/19/2010 1407  ? ALT 78 (H) 08/19/2010 1407  ? BILITOT 0.5 08/19/2010 1407  ?  ? ? ?Lab Results  ?Component Value Date  ? WBC 6.7 02/27/2020  ? HGB 14.3 02/27/2020  ? HCT 41.5 02/27/2020  ? MCV 96.1 02/27/2020  ? PLT 144 (L) 02/27/2020  ? ?No results found for: TSH ?  Chemistry   ?   ?Component Value Date/Time  ? NA 139 02/27/2020 0639  ? K 4.2 02/27/2020 0639  ? CL 100 02/27/2020 0639  ? CO2 28 02/27/2020 0639  ? BUN 15 02/27/2020 0639  ? CREATININE 0.90 02/27/2020 0639  ?    ?Component Value Date/Time  ? CALCIUM 9.4 02/27/2020 0639  ? ALKPHOS 47 08/19/2010 1407  ? AST 73 (H) 08/19/2010 1407  ? ALT 78 (H) 08/19/2010 1407  ? BILITOT 0.5 08/19/2010 1407  ?  ? ? ? ? ?Cc:   Pomposini, 08/21/2010, MD ? ?

## 2021-06-13 ENCOUNTER — Other Ambulatory Visit: Payer: Self-pay

## 2021-06-13 ENCOUNTER — Encounter: Payer: Self-pay | Admitting: Neurology

## 2021-06-13 ENCOUNTER — Ambulatory Visit (INDEPENDENT_AMBULATORY_CARE_PROVIDER_SITE_OTHER): Payer: Medicare Other | Admitting: Neurology

## 2021-06-13 VITALS — BP 122/80 | HR 64 | Ht 66.0 in | Wt 187.8 lb

## 2021-06-13 DIAGNOSIS — R2681 Unsteadiness on feet: Secondary | ICD-10-CM

## 2021-06-13 DIAGNOSIS — G250 Essential tremor: Secondary | ICD-10-CM

## 2021-06-13 NOTE — Procedures (Signed)
Manufacturer of DBS device: [employer] with bluetooth capability ? ?Total time spent programming and in patient education on programming was 15 minutes.  Device was found to be on.    Soft start was confirmed to be on.  Impedences were checked and were within normal limits.  Battery was checked.  Programmer needed charging (pt reminded).  Final settings were as follows: ? ? ? Active Contact Amplitude (mA) PW (ms) Frequency (hz) Side Effects  ?Left Brain       ?03/25/20  1-C+ 2.0 90 159 lightheaded  ? 5/6/7(33%each)-C+ 2.0 90 159   ? 8-C+ 2.0 90 159   ?Program 1 final and active 2/3/4(33%each)-5/6/7(33% each)+ 2.2 90 159   ?Program 2 final 2/3/4(33% each)-C+ 2.0 60 159   ?04/05/20 2/3/4(33% each)-5/6/7(33%each)+ 2.4 90 159   ?06/13/20 2/3/4(33% each)-5/6/7(33%each)+ 2.7 (1.4-3.0) 90 159   ?06/13/21 2/3/4(33% each)-5/6/7(33%each)+ 2.7 90 159   ?       ?       ?Right Brain       ?n/a       ?       ? ? ?

## 2021-09-18 ENCOUNTER — Encounter: Payer: Self-pay | Admitting: Physician Assistant

## 2021-09-18 ENCOUNTER — Ambulatory Visit (INDEPENDENT_AMBULATORY_CARE_PROVIDER_SITE_OTHER): Payer: Medicare Other | Admitting: Physician Assistant

## 2021-09-18 VITALS — Ht 66.5 in | Wt 189.0 lb

## 2021-09-18 DIAGNOSIS — R42 Dizziness and giddiness: Secondary | ICD-10-CM | POA: Diagnosis not present

## 2021-09-18 DIAGNOSIS — R2681 Unsteadiness on feet: Secondary | ICD-10-CM

## 2021-09-18 NOTE — Patient Instructions (Addendum)
Follow up in 2 months  Make sure ENT send information Continue meclizine as needed Monitor the orthostatic blood pressure, follow with primary doctor  Drink plenty water

## 2021-09-18 NOTE — Progress Notes (Addendum)
Assessment/Plan:    1.  Essential Tremor  -Patient is status post left VIM DBS with [employer] device on February 27, 2020.  Patient had IPG placed on March 05, 2020. Device was reprogrammed during last visit  Follow up as scheduled on 05/2022 with Dr. Arbutus Leas  2.  Gait instability.  -Patient has perception that he has R foot drop, not noticed on exam today. No ataxia seen.    3. Vertigo  Intermittent,about 2 x a month, accompanied by sensation of fatigue but no LOC.  occasional nausea, dizziness,  no vomiting,  worse with certain head movements. Has not tried anything for symptoms. No neck pain. Denies any recent URI or flulike symptoms. No history of stroke, heart disease or labyrinthitis. Noted orthostatic of >24 mmHg from sitting to standing systolic .   Recommend ENT referral  Recommend follow up with PCP Orthostatic Hypotension, may need Cards evaluation  Follow up in 3 months Continue meclizine prn      Subjective:   Juan Spencer was seen today in follow up for vertigo.  My previous records were reviewed prior to todays visit.   PDMP is reviewed. Intermittent,about 2 x a month, accompanied by sensation of fatigue but no LOC.  occasional nausea, dizziness,  no vomiting,  worse with certain head movements. Has not tried anything for symptoms. No neck pain. Denies any recent URI or flulike symptoms. No history of stroke, or labyrinthitis. Drinks water. Noted orthostatic of >24 mmHg from sitting to standing systolic . Never seen cardiology.   PREVIOUS MEDICATIONS: PREVIOUS MEDICATIONS: primidone; propranolol; artane; cogentin; klonopin; carbidopa/levodopa  ALLERGIES:   Allergies  Allergen Reactions   Aleve [Naproxen Sodium] Anaphylaxis, Hives and Swelling   Motrin [Ibuprofen] Anaphylaxis, Hives and Swelling    CURRENT MEDICATIONS:  Outpatient Encounter Medications as of 09/18/2021  Medication Sig   acetaminophen (TYLENOL) 500 MG tablet Take 1,000 mg by mouth every 6  (six) hours as needed.   Apoaequorin (PREVAGEN) 10 MG CAPS Take 10 mg by mouth daily.    docusate sodium (COLACE) 100 MG capsule Take 200 mg by mouth 2 (two) times daily. Stool softner   Echinacea-Vitamin C 250-250 MG CAPS Take by mouth.   Glucosamine-Chondroit-Vit C-Mn (GLUCOSAMINE 1500 COMPLEX PO) Take 1 tablet by mouth in the morning and at bedtime.    montelukast (SINGULAIR) 10 MG tablet Take 10 mg by mouth daily as needed ([place] for allergies).    Multiple Vitamin (MULTIVITAMIN WITH MINERALS) TABS tablet Take 1 tablet by mouth 2 (two) times daily. Mega Men 50 +   Omega-3 Fatty Acids (FISH OIL) 1000 MG CAPS Take 2,000 mg by mouth 2 (two) times daily.    omeprazole (PRILOSEC) 20 MG capsule Take 40 mg by mouth daily.   OVER THE COUNTER MEDICATION Take 2,000 mg by mouth daily. CBD   predniSONE (DELTASONE) 5 MG tablet Take 5 mg by mouth daily with breakfast.   simvastatin (ZOCOR) 40 MG tablet Take 40 mg by mouth at bedtime.    traZODone (DESYREL) 100 MG tablet Take 100 mg by mouth at bedtime.    Vitamin Mixture (ESTER-C PO) Take 500 mg by mouth daily.    Cholecalciferol (VITAMIN D) 50 MCG (2000 UT) CAPS Take by mouth daily. (Patient not taking: Reported on 09/18/2021)   clonazePAM (KLONOPIN) 0.25 MG disintegrating tablet Take 0.25 mg by mouth at bedtime. Not taking at this time (Patient not taking: Reported on 09/18/2021)   HYDROcodone-acetaminophen (NORCO/VICODIN) 5-325 MG tablet Take 2  tablets by mouth every 4 (four) hours as needed for severe pain ((score 7 to 10)). (Patient not taking: Reported on 09/18/2021)   sildenafil (REVATIO) 20 MG tablet Take 100 mg by mouth daily as needed for erectile dysfunction.   tadalafil (CIALIS) 5 MG tablet Take 5 mg by mouth at bedtime. (Patient not taking: Reported on 09/18/2021)   No facility-administered encounter medications on file as of 09/18/2021.     Objective:    PHYSICAL EXAMINATION:    VITALS:   Vitals:   09/18/21 1336  SpO2: 93%   Weight: 189 lb (85.7 kg)  Height: 5' 6.5" (1.689 m)    Lying : 146/74 Sitting 146/80 Standing 122/80  GEN:  The patient appears stated age and is in NAD. HEENT:  Normocephalic. The mucous membranes are moist. The superficial temporal arteries are without ropiness or tenderness. CV:  RRR Lungs:  CTAB.   Neck/HEME:  There are no carotid bruits bilaterally.   Neurological examination:  Orientation: The patient is alert and oriented x3. Cranial nerves: There is good facial symmetry. extraocular muscles are intact.  The speech is fluent and clear. Soft palate rises symmetrically and there is no tongue deviation. Hearing is intact to conversational tone. Sensation: Sensation is intact to light touch throughout Motor: Strength is at least antigravity x4.  Movement examination: Tone: There is normal tone in the UE/LE Abnormal movements: No postural tremor. Very mild L intention tremor is noted.  No rest tremor. Coordination:  There is no decremation with RAM's Gait and Station: The patient has difficulty arising out of a deep-seated chair without the use of the hands. The patient's stride length is good with good arm swing.  No foot drop. I have reviewed and interpreted the following labs independently   Cc:  Pomposini, Rande Brunt, MD

## 2021-09-22 DIAGNOSIS — R42 Dizziness and giddiness: Secondary | ICD-10-CM | POA: Insufficient documentation

## 2021-11-20 ENCOUNTER — Encounter: Payer: Self-pay | Admitting: Physician Assistant

## 2021-11-20 ENCOUNTER — Ambulatory Visit (INDEPENDENT_AMBULATORY_CARE_PROVIDER_SITE_OTHER): Payer: Medicare Other | Admitting: Physician Assistant

## 2021-11-20 VITALS — BP 148/73 | HR 69 | Resp 18 | Ht 66.5 in | Wt 192.0 lb

## 2021-11-20 DIAGNOSIS — G250 Essential tremor: Secondary | ICD-10-CM | POA: Diagnosis not present

## 2021-11-20 DIAGNOSIS — R42 Dizziness and giddiness: Secondary | ICD-10-CM | POA: Diagnosis not present

## 2021-11-20 NOTE — Patient Instructions (Signed)
Follow up with Dr. Arbutus Leas as scheduled  Make sure ENT send information Continue meclizine as needed Monitor the orthostatic blood pressure, follow with primary doctor and cardiology  Drink plenty water

## 2021-11-20 NOTE — Progress Notes (Signed)
Assessment/Plan:    Essential Tremor Patient is doing well, there are no new parkinsonian signs.  He continues to have mild tremors on the left hand, the right hand no tremors are noted.  The rest of the exam was normal.  Patient is status post left VIM DBS with [employer] device in February 27, 2020.  He had IPG placed on March 05, 2020.  Device was programmed during a prior visit. Follow-up as scheduled in March 2024 with Dr. Arbutus Leas.     Gait instability, subjective  Patient has perception that he has right foot drop, not obvious on exam today.  No ataxia was seen.  He reports that this is not worsening than prior.    Vertigo   Has been seen by ENT, no notes are available for review.  According to the patient, "they run of the tests and they were all normal".  Last episode was about 1 month ago, lasting 3 or 4 minutes, took meclizine with resolution.  Denies nausea, dizziness, no vomiting.  Worse with certain head movements No neck pain.  Denies recent URI or flulike symptoms.  No history of stroke, heart disease or diabetes mellitus.  He has been seen by cardiology, no further orthostatic hypotension was noted.  He is now drinking more water as well. Obtain ENT notes Follow up with Dr. Arbutus Leas as scheduled Continue meclizine prn  He reports that if needed, he will seek vestibular therapy.     Subjective:   Taylon Coole was seen today in follow up for vertigo.  Previous records were reviewed prior to today's visit.  PDMP is reviewed, intermittent, last episode about 1 month ago, lasting for about 3 or 4 minutes.  The patient took meclizine, with resolution of the symptoms, and no further episodes since.  He had nausea but not recently, dizziness, abdominal pain.  Worse with certain head movements.  No neck pain.  Denies recent URI or flulike symptoms, loss of stroke or labyrinthitis he was seen by ENT, well not finding any abnormalities during physical exam.  No notes are  available for review however.  Drinks more water than prior, and has not experienced orthostatic hypotension.  He has been seen by cardiology, with negative work-up    PREVIOUS MEDICATIONS: PREVIOUS MEDICATIONS: primidone; propranolol; artane; cogentin; klonopin; carbidopa/levodopa  ALLERGIES:   Allergies  Allergen Reactions   Aleve [Naproxen Sodium] Anaphylaxis, Hives and Swelling   Motrin [Ibuprofen] Anaphylaxis, Hives and Swelling    CURRENT MEDICATIONS:  Outpatient Encounter Medications as of 11/20/2021  Medication Sig   acetaminophen (TYLENOL) 500 MG tablet Take 1,000 mg by mouth every 6 (six) hours as needed.   Apoaequorin (PREVAGEN) 10 MG CAPS Take 10 mg by mouth daily.    docusate sodium (COLACE) 100 MG capsule Take 200 mg by mouth 2 (two) times daily. Stool softner   Echinacea-Vitamin C 250-250 MG CAPS Take by mouth.   Glucosamine-Chondroit-Vit C-Mn (GLUCOSAMINE 1500 COMPLEX PO) Take 1 tablet by mouth in the morning and at bedtime.    montelukast (SINGULAIR) 10 MG tablet Take 10 mg by mouth daily as needed ([place] for allergies).    Multiple Vitamin (MULTIVITAMIN WITH MINERALS) TABS tablet Take 1 tablet by mouth 2 (two) times daily. Mega Men 50 +   Omega-3 Fatty Acids (FISH OIL) 1000 MG CAPS Take 2,000 mg by mouth 2 (two) times daily.    omeprazole (PRILOSEC) 20 MG capsule Take 40 mg by mouth daily.   OVER THE COUNTER  MEDICATION Take 2,000 mg by mouth daily. CBD   predniSONE (DELTASONE) 5 MG tablet Take 5 mg by mouth daily with breakfast.   sildenafil (REVATIO) 20 MG tablet Take 100 mg by mouth daily as needed for erectile dysfunction.   simvastatin (ZOCOR) 40 MG tablet Take 40 mg by mouth at bedtime.    traZODone (DESYREL) 100 MG tablet Take 100 mg by mouth at bedtime.    Vitamin Mixture (ESTER-C PO) Take 500 mg by mouth daily.    Cholecalciferol (VITAMIN D) 50 MCG (2000 UT) CAPS Take by mouth daily. (Patient not taking: Reported on 09/18/2021)   clonazePAM (KLONOPIN)  0.25 MG disintegrating tablet Take 0.25 mg by mouth at bedtime. Not taking at this time (Patient not taking: Reported on 09/18/2021)   HYDROcodone-acetaminophen (NORCO/VICODIN) 5-325 MG tablet Take 2 tablets by mouth every 4 (four) hours as needed for severe pain ((score 7 to 10)). (Patient not taking: Reported on 09/18/2021)   tadalafil (CIALIS) 5 MG tablet Take 5 mg by mouth at bedtime. (Patient not taking: Reported on 09/18/2021)   No facility-administered encounter medications on file as of 11/20/2021.     Objective:    PHYSICAL EXAMINATION:    VITALS:   Vitals:   11/20/21 1424  BP: (!) 148/73  Pulse: 69  Resp: 18  SpO2: 97%  Weight: 192 lb (87.1 kg)  Height: 5' 6.5" (1.689 m)     Lying : 146/74 Sitting 146/80 Standing 122/80  GEN:  The patient appears stated age and is in NAD.  No hypomimia is noted HEENT:  Normocephalic. The mucous membranes are moist. The superficial temporal arteries are without ropiness or tenderness. CV:  RRR Lungs:  CTAB.   Neck/HEME:  There are no carotid bruits bilaterally.   Neurological examination:  Orientation: The patient is alert and oriented x3. Cranial nerves: There is good facial symmetry. extraocular muscles are intact.  The speech is fluent and clear. Soft palate rises symmetrically and there is no tongue deviation. Hearing is intact to conversational tone. Sensation: Sensation is intact to light touch throughout Motor: Strength is at least antigravity x4.  Movement examination: Tone: There is normal tone in the UE/LE.  No cogwheeling is noted. Abnormal movements: No postural tremor.  very mild intention tremor on the left is noted, no rest tremor.  On the right, no tremor is noted.   Coordination:  There is no decremation with RAM's Gait and Station: The patient has no difficulty arising out of a deep-seated chair without the use of the hands. The patient's stride length is good with good arm swing.  No foot drop.  I have reviewed  and interpreted the following labs independently   Cc:  Pomposini, Rande Brunt, MD

## 2022-04-10 IMAGING — CT CT DBS HEAD W/O CONTRAST
1 series · 15 of 30 positions shown, 19 images · non-contrast
Comparison: Preoperative head CT 02/20/2020. Preoperative MRI
01/04/2020 and earlier.

CLINICAL DATA: 74-year-old male status post left deep brain
stimulator placement today.

EXAM:
CT HEAD WITHOUT CONTRAST
TECHNIQUE: Contiguous axial images were obtained from the base of the skull
through the vertex without intravenous contrast.

[Series 3: ax standard · axial · 0.48mm/px · z∈[-480,-304]mm · 15 of 190 slices shown, 19 images]
[im 7/190  brain]
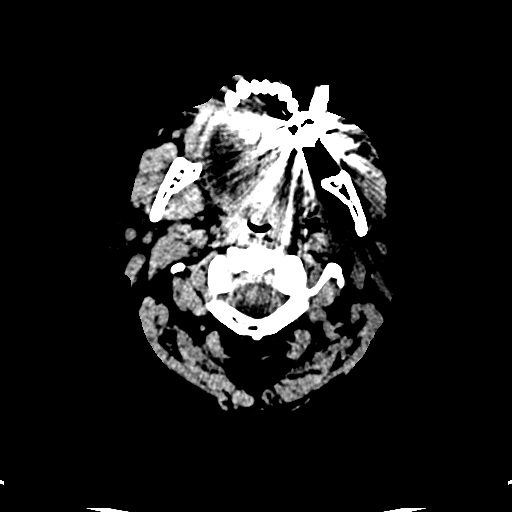
[im 7/190  bone]
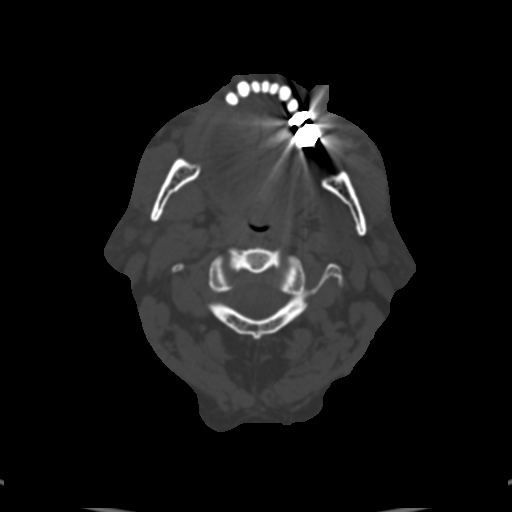
[im 20/190  brain]
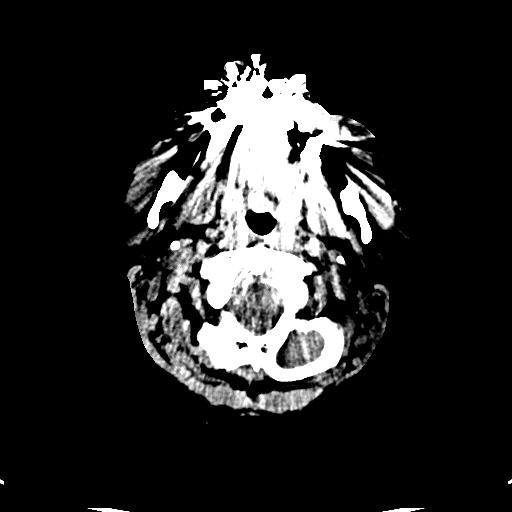
[im 33/190  brain]
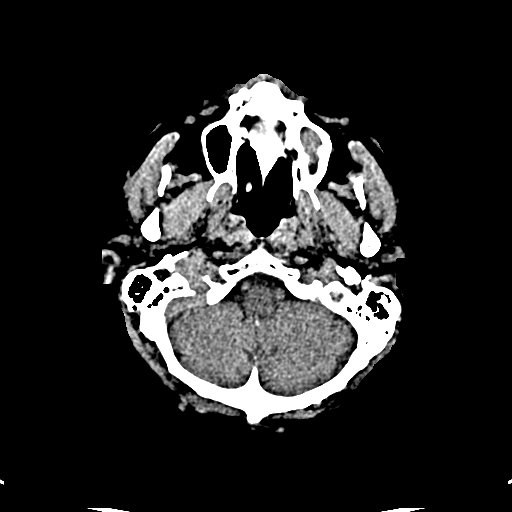
[im 46/190  brain]
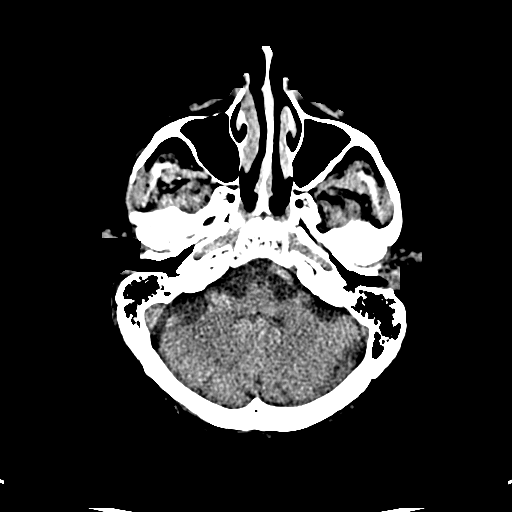
[im 59/190  brain]
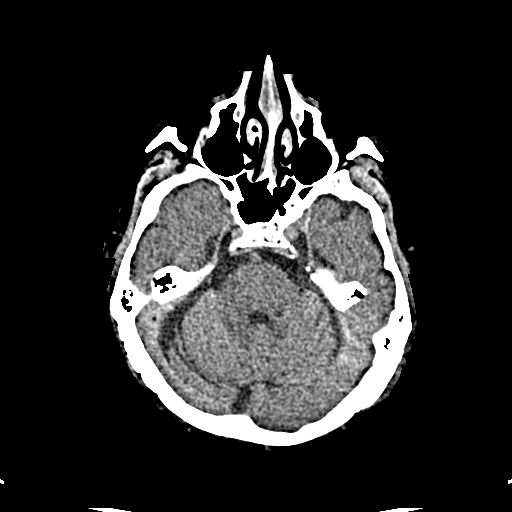
[im 59/190  bone]
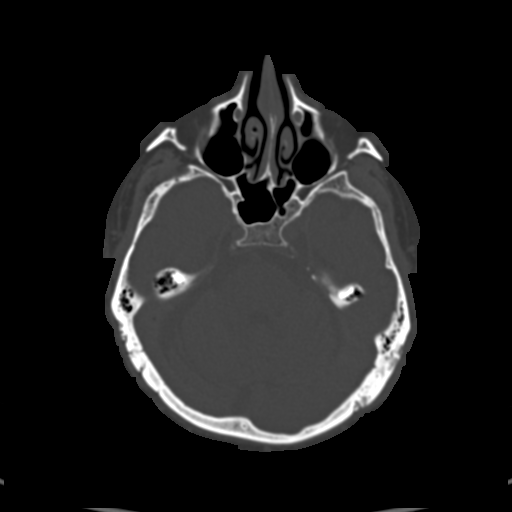
[im 72/190  brain]
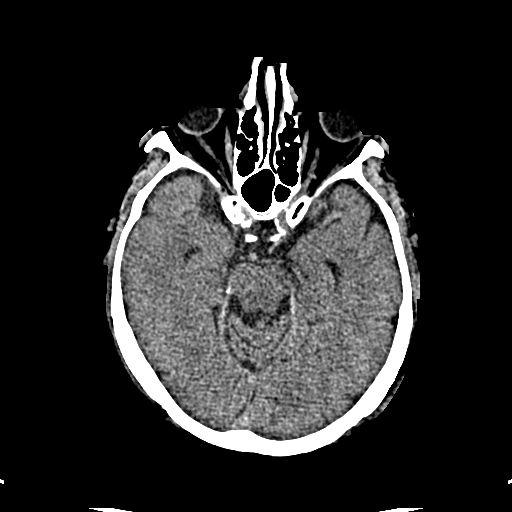
[im 85/190  brain]
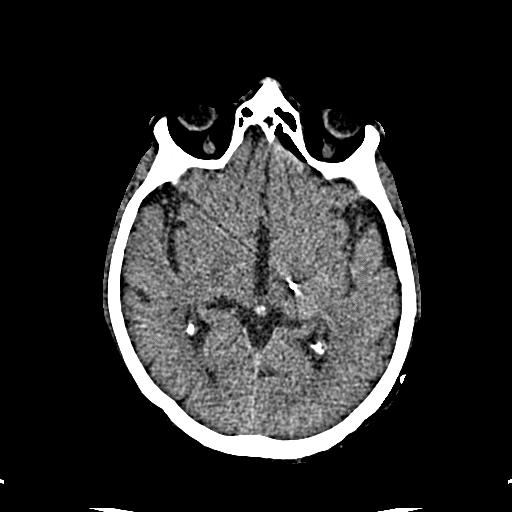
[im 98/190  brain]
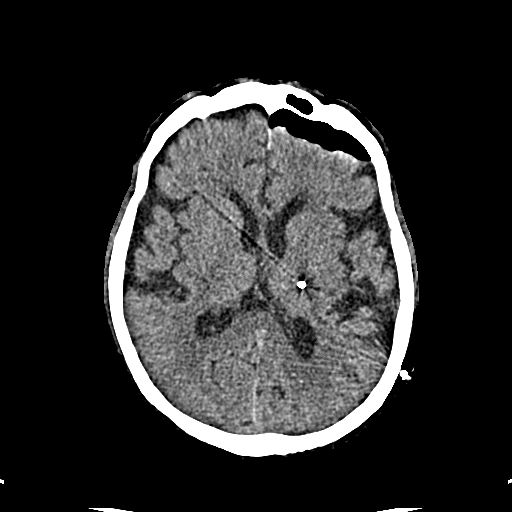
[im 105/190  brain]
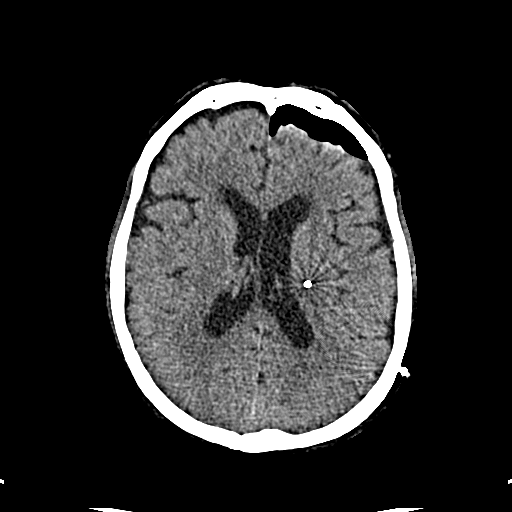
[im 105/190  bone]
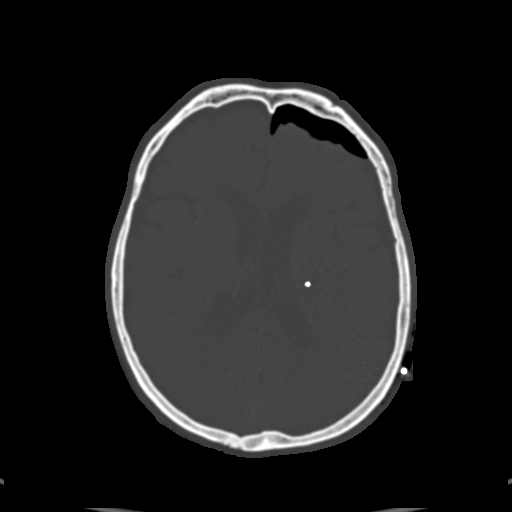
[im 118/190  brain]
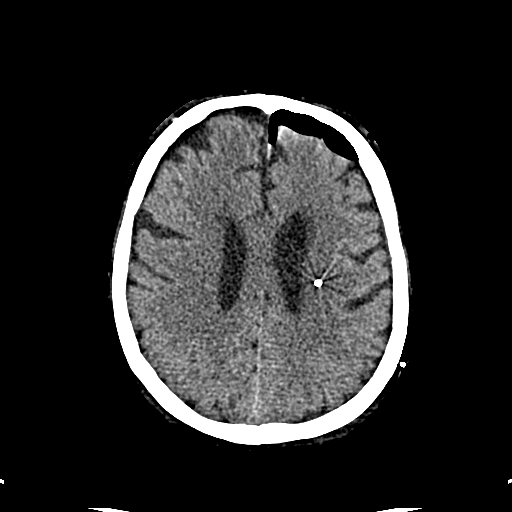
[im 131/190  brain]
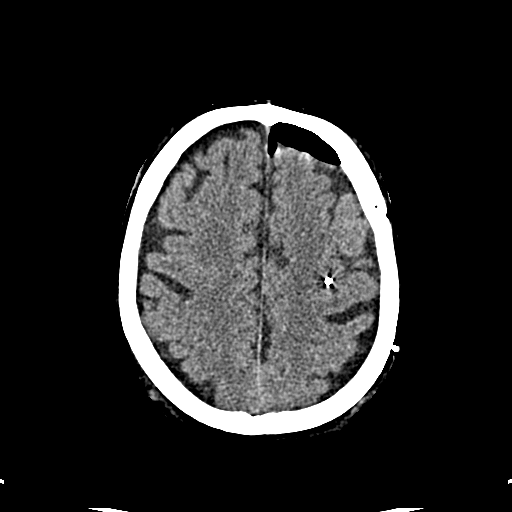
[im 144/190  brain]
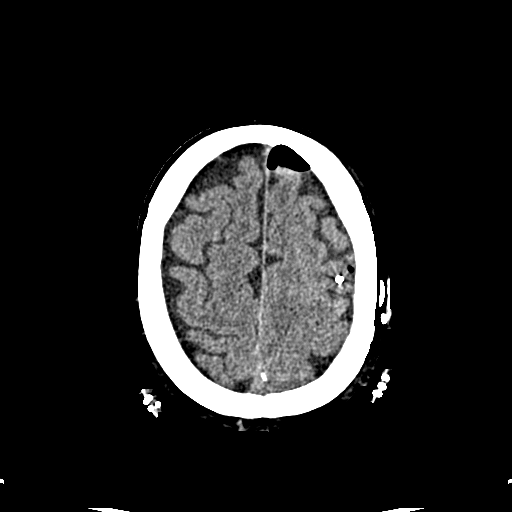
[im 157/190  brain]
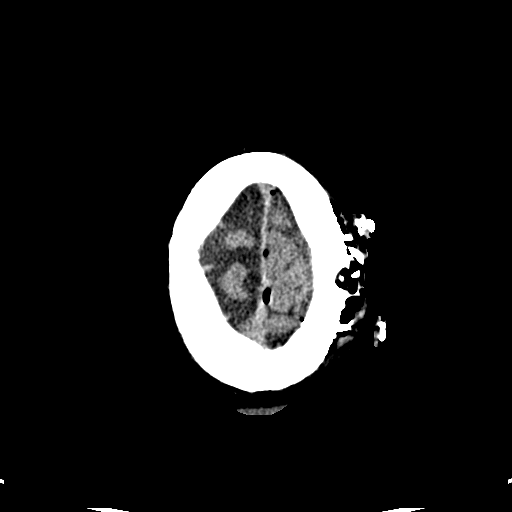
[im 157/190  bone]
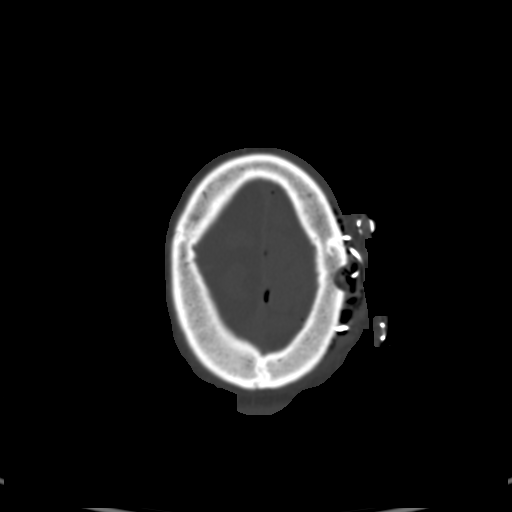
[im 170/190  brain]
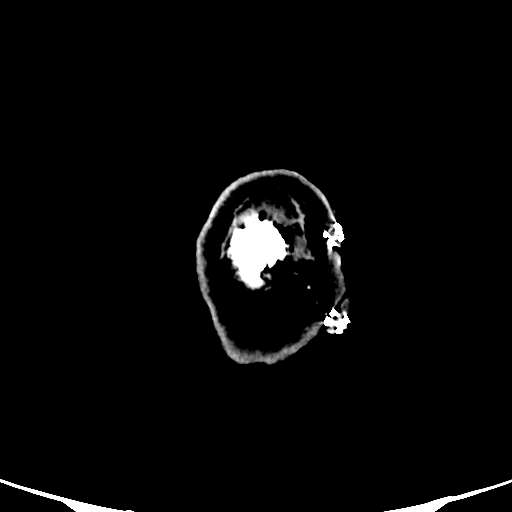
[im 183/190  brain]
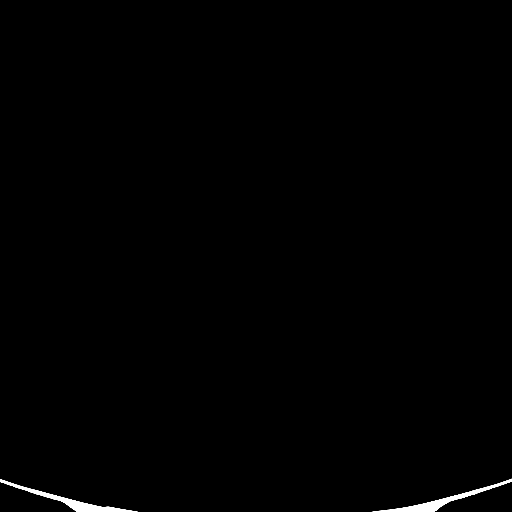

[15 of 30 positions shown; findings below may reference images not displayed]

FINDINGS: Brain: Small volume pneumocephalus at the left vertex and along the
anterior left frontal convexity. Left vertex approach deep brain
stimulator lead terminates in the region of the thalamus. No
hemorrhage or edema evident along the course of the lead.

Stable underlying bilateral gray-white matter differentiation. No
midline shift, mass effect, or evidence of intracranial mass lesion.
No ventriculomegaly. No intracranial hemorrhage or cortically based
infarct identified.

Vascular: Calcified atherosclerosis at the skull base. No suspicious
intracranial vascular hyperdensity.

Skull: Left vertex burr hole.  Interval removal of fiducials.

Sinuses/Orbits: Mild chronic sinusitis left maxillary alveolar
recess. Other paranasal sinuses and mastoids are stable and well
pneumatized.

Other: Postoperative changes to the bilateral scalp soft tissues
with skin staples. Coiled left vertex scalp electrode with
extracranial portion continuing along the left posterior scalp
convexity.

Leftward gaze, otherwise negative orbits.
IMPRESSION: 1. Left vertex approach deep brain stimulator placed with no adverse
features. Small volume postoperative pneumocephalus in the left
hemisphere.

2. Postoperative changes to the scalp and skull, including coiled
subcutaneous electrode lead on the left.

## 2022-06-12 NOTE — Progress Notes (Signed)
Assessment/Plan:    1.  Essential Tremor  -Patient is status post left VIM DBS with [employer] device on February 27, 2020.  Patient had IPG placed on March 05, 2020.   Device was off today when came in (that happened last visit as well but pt stated that he thinks he turned it off accidentally about 1.5 hours before he came in today).  Device turned on and reprogrammed.  2.  Gait instability.  -Patient has perception that he is dragging the right leg.  He reports it is intermittent.  I did not see this on exam.  He has no foot drop.  If this becomes worse or is persistent, he will let me know.    Subjective:   Juan Spencer was seen today in follow up for essential tremor.  He had some vertigo last year but this seems to come and go.  He does not have it today.  He reports that he has a feeling that his leg intermittently drags.  It is not consistent.  It is the right leg.  He reported this last year as well.  In regards to tremor, he reports that overall he is doing fairly well.  He sometimes has to hold the fork pretty time he eats, especially if the arm is held in the wing beating position.  The harder he grasps on to something the better the control.    PREVIOUS MEDICATIONS: PREVIOUS MEDICATIONS: primidone; propranolol; artane; cogentin; klonopin; carbidopa/levodopa  ALLERGIES:   Allergies  Allergen Reactions   Aleve [Naproxen Sodium] Anaphylaxis, Hives and Swelling   Motrin [Ibuprofen] Anaphylaxis, Hives and Swelling    CURRENT MEDICATIONS:  Outpatient Encounter Medications as of 06/15/2022  Medication Sig   acetaminophen (TYLENOL) 500 MG tablet Take 1,000 mg by mouth every 6 (six) hours as needed.   Apoaequorin (PREVAGEN) 10 MG CAPS Take 10 mg by mouth daily.    Cholecalciferol (VITAMIN D) 50 MCG (2000 UT) CAPS Take by mouth daily. (Patient not taking: Reported on 09/18/2021)   clonazePAM (KLONOPIN) 0.25 MG disintegrating tablet Take 0.25 mg by mouth at bedtime.  Not taking at this time (Patient not taking: Reported on 09/18/2021)   docusate sodium (COLACE) 100 MG capsule Take 200 mg by mouth 2 (two) times daily. Stool softner   Echinacea-Vitamin C 250-250 MG CAPS Take by mouth.   Glucosamine-Chondroit-Vit C-Mn (GLUCOSAMINE 1500 COMPLEX PO) Take 1 tablet by mouth in the morning and at bedtime.    HYDROcodone-acetaminophen (NORCO/VICODIN) 5-325 MG tablet Take 2 tablets by mouth every 4 (four) hours as needed for severe pain ((score 7 to 10)). (Patient not taking: Reported on 09/18/2021)   montelukast (SINGULAIR) 10 MG tablet Take 10 mg by mouth daily as needed ([place] for allergies).    Multiple Vitamin (MULTIVITAMIN WITH MINERALS) TABS tablet Take 1 tablet by mouth 2 (two) times daily. Mega Men 50 +   Omega-3 Fatty Acids (FISH OIL) 1000 MG CAPS Take 2,000 mg by mouth 2 (two) times daily.    omeprazole (PRILOSEC) 20 MG capsule Take 40 mg by mouth daily.   OVER THE COUNTER MEDICATION Take 2,000 mg by mouth daily. CBD   predniSONE (DELTASONE) 5 MG tablet Take 5 mg by mouth daily with breakfast.   sildenafil (REVATIO) 20 MG tablet Take 100 mg by mouth daily as needed for erectile dysfunction.   simvastatin (ZOCOR) 40 MG tablet Take 40 mg by mouth at bedtime.    tadalafil (CIALIS) 5 MG tablet Take  5 mg by mouth at bedtime. (Patient not taking: Reported on 09/18/2021)   traZODone (DESYREL) 100 MG tablet Take 100 mg by mouth at bedtime.    Vitamin Mixture (ESTER-C PO) Take 500 mg by mouth daily.    No facility-administered encounter medications on file as of 06/15/2022.     Objective:    PHYSICAL EXAMINATION:    VITALS:   There were no vitals filed for this visit.    GEN:  The patient appears stated age and is in NAD. HEENT:  Normocephalic. The mucous membranes are moist. The superficial temporal arteries are without ropiness or tenderness.    Neurological examination:  Orientation: The patient is alert and oriented x3. Cranial nerves:  There is good facial symmetry. extraocular muscles are intact.  The speech is fluent and clear. Soft palate rises symmetrically and there is no tongue deviation. Hearing is intact to conversational tone. Sensation: Sensation is intact to light touch throughout Motor: Strength is at least antigravity x4.  Movement examination: Tone: There is normal tone in the UE/LE Abnormal movements: Very minimal postural tremor, including in the wing beating position. Coordination:  There is no decremation with RAM's Gait and Station: The patient ambulates well in the hall.  I did not see him dragging the right leg.  I did not see any foot drop. I have reviewed and interpreted the following labs independently   Chemistry      Component Value Date/Time   NA 139 02/27/2020 0639   K 4.2 02/27/2020 0639   CL 100 02/27/2020 0639   CO2 28 02/27/2020 0639   BUN 15 02/27/2020 0639   CREATININE 0.90 02/27/2020 0639      Component Value Date/Time   CALCIUM 9.4 02/27/2020 0639   ALKPHOS 47 08/19/2010 1407   AST 73 (H) 08/19/2010 1407   ALT 78 (H) 08/19/2010 1407   BILITOT 0.5 08/19/2010 1407      Lab Results  Component Value Date   WBC 6.7 02/27/2020   HGB 14.3 02/27/2020   HCT 41.5 02/27/2020   MCV 96.1 02/27/2020   PLT 144 (L) 02/27/2020   No results found for: "TSH"   Chemistry      Component Value Date/Time   NA 139 02/27/2020 0639   K 4.2 02/27/2020 0639   CL 100 02/27/2020 0639   CO2 28 02/27/2020 0639   BUN 15 02/27/2020 0639   CREATININE 0.90 02/27/2020 0639      Component Value Date/Time   CALCIUM 9.4 02/27/2020 0639   ALKPHOS 47 08/19/2010 1407   AST 73 (H) 08/19/2010 1407   ALT 78 (H) 08/19/2010 1407   BILITOT 0.5 08/19/2010 1407        Cc:  Pomposini, Rande Brunt, MD

## 2022-06-15 ENCOUNTER — Encounter: Payer: Self-pay | Admitting: Neurology

## 2022-06-15 ENCOUNTER — Ambulatory Visit (INDEPENDENT_AMBULATORY_CARE_PROVIDER_SITE_OTHER): Payer: Medicare Other | Admitting: Neurology

## 2022-06-15 VITALS — BP 138/80 | HR 65 | Ht 66.0 in | Wt 195.6 lb

## 2022-06-15 DIAGNOSIS — R2681 Unsteadiness on feet: Secondary | ICD-10-CM | POA: Diagnosis not present

## 2022-06-15 DIAGNOSIS — Z9689 Presence of other specified functional implants: Secondary | ICD-10-CM | POA: Diagnosis not present

## 2022-06-15 DIAGNOSIS — G250 Essential tremor: Secondary | ICD-10-CM

## 2022-06-15 NOTE — Procedures (Signed)
Manufacturer of DBS device: [employer] with bluetooth capability  Total time spent programming and in patient education on programming was 25 minutes.  Device was off and turned on.    Soft start was confirmed to be on.  Impedences were checked and were within normal limits.  Battery was checked.  Programmer needed charging (pt reminded).  This was the same as last visit.  Final settings were as follows:    Active Contact Amplitude (mA) PW (ms) Frequency (hz) Side Effects  Left Brain       03/25/20  1-C+ 2.0 90 159 lightheaded   5/6/7(33%each)-C+ 2.0 90 159    8-C+ 2.0 90 159   Program 1 final and active 2/3/4(33%each)-5/6/7(33% each)+ 2.2 90 159   Program 2 final 2/3/4(33% each)-C+ 2.0 60 159   04/05/20 2/3/4(33% each)-5/6/7(33%each)+ 2.4 90 159   06/13/20 2/3/4(33% each)-5/6/7(33%each)+ 2.7 (1.4-3.0) 90 159   06/13/21 2/3/4(33% each)-5/6/7(33%each)+ 2.7 90 159   06/15/22 (2a/b/c)- 33% each(3a/b/c)+33%each 3.0 90 159                 Right Brain       n/a

## 2023-06-15 NOTE — Progress Notes (Unsigned)
 Assessment/Plan:    1.  Essential Tremor  -Patient is status post left VIM DBS with [employer] device on February 27, 2020.  Patient had IPG placed on March 05, 2020.   Pt doing well with the device.  He did have to contact the DBS rep while he was in the office today, as we had some difficulties connecting, but were able to accomplish it.  2.  Gait instability/paresthesia.  -Patient has perception that the lower leg below the knee is intermittently heavy.  He had this perception last visit as well.  I have not noted it on examination.  There is no foot drop.  He wondered if it was related to the stimulator, and I told him I did not think so.  We discussed that we could examine it further either through MRI and/or EMG, but the symptom really is not that bothersome or persistent and he decided to hold on further examination.      Subjective:   Juan Spencer was seen today in follow up for essential tremor.  He reports he is doing fairly well.  He has had no falls.  No lightheadedness or near syncope.  He reports his DBS is doing well.  He still c/o intermittent heaviness in the R foot and lower leg.  No back pain.  It doesn't really bother him.  He wonders if related to the DBS.  He asked about that last visit as well.  It is very intermittent and he cannot predict it.    PREVIOUS MEDICATIONS: PREVIOUS MEDICATIONS: primidone; propranolol; artane; cogentin; klonopin; carbidopa/levodopa  ALLERGIES:   Allergies  Allergen Reactions   Aleve [Naproxen Sodium] Anaphylaxis, Hives and Swelling   Motrin [Ibuprofen] Anaphylaxis, Hives and Swelling    CURRENT MEDICATIONS:  Outpatient Encounter Medications as of 06/17/2023  Medication Sig   acetaminophen (TYLENOL) 500 MG tablet Take 1,000 mg by mouth every 6 (six) hours as needed.   Apoaequorin (PREVAGEN) 10 MG CAPS Take 10 mg by mouth daily.    docusate sodium (COLACE) 100 MG capsule Take 200 mg by mouth 2 (two) times daily. Stool  softner   Echinacea-Vitamin C 250-250 MG CAPS Take by mouth.   Glucosamine-Chondroit-Vit C-Mn (GLUCOSAMINE 1500 COMPLEX PO) Take 1 tablet by mouth in the morning and at bedtime.    montelukast (SINGULAIR) 10 MG tablet Take 10 mg by mouth daily as needed ([place] for allergies).    Multiple Vitamin (MULTIVITAMIN WITH MINERALS) TABS tablet Take 1 tablet by mouth 2 (two) times daily. Mega Men 50 +   Omega-3 Fatty Acids (FISH OIL) 1000 MG CAPS Take 2,000 mg by mouth 2 (two) times daily.    omeprazole (PRILOSEC) 20 MG capsule Take 40 mg by mouth daily.   predniSONE (DELTASONE) 5 MG tablet Take 5 mg by mouth daily with breakfast.   sildenafil (REVATIO) 20 MG tablet Take 100 mg by mouth daily as needed for erectile dysfunction.   simvastatin (ZOCOR) 40 MG tablet Take 40 mg by mouth at bedtime.    traZODone (DESYREL) 100 MG tablet Take 100 mg by mouth at bedtime.    Vitamin Mixture (ESTER-C PO) Take 500 mg by mouth daily.    No facility-administered encounter medications on file as of 06/17/2023.     Objective:    PHYSICAL EXAMINATION:    VITALS:   Vitals:   06/17/23 0916  BP: 138/80  Pulse: 68  SpO2: 97%  Weight: 184 lb (83.5 kg)  Height: 5\' 7"  (1.702  m)      GEN:  The patient appears stated age and is in NAD. HEENT:  Normocephalic. The mucous membranes are moist. The superficial temporal arteries are without ropiness or tenderness.    Neurological examination:  Orientation: The patient is alert and oriented x3. Cranial nerves: There is good facial symmetry. extraocular muscles are intact.  The speech is fluent and clear. Soft palate rises symmetrically and there is no tongue deviation. Hearing is intact to conversational tone. Sensation: Sensation is intact to light touch throughout Motor: Strength is at least antigravity x4.  Movement examination: Tone: There is normal tone in the UE/LE Abnormal movements: No significant tremor today. Coordination:  There is no  decremation with RAM's Gait and Station: The patient ambulates well in the hall.  No foot drop.  No marching gait.  No shuffling.  No gait instability. I have reviewed and interpreted the following labs independently   Chemistry      Component Value Date/Time   NA 139 02/27/2020 0639   K 4.2 02/27/2020 0639   CL 100 02/27/2020 0639   CO2 28 02/27/2020 0639   BUN 15 02/27/2020 0639   CREATININE 0.90 02/27/2020 0639      Component Value Date/Time   CALCIUM 9.4 02/27/2020 0639   ALKPHOS 47 08/19/2010 1407   AST 73 (H) 08/19/2010 1407   ALT 78 (H) 08/19/2010 1407   BILITOT 0.5 08/19/2010 1407      Lab Results  Component Value Date   WBC 6.7 02/27/2020   HGB 14.3 02/27/2020   HCT 41.5 02/27/2020   MCV 96.1 02/27/2020   PLT 144 (L) 02/27/2020   No results found for: "TSH"   Chemistry      Component Value Date/Time   NA 139 02/27/2020 0639   K 4.2 02/27/2020 0639   CL 100 02/27/2020 0639   CO2 28 02/27/2020 0639   BUN 15 02/27/2020 0639   CREATININE 0.90 02/27/2020 0639      Component Value Date/Time   CALCIUM 9.4 02/27/2020 0639   ALKPHOS 47 08/19/2010 1407   AST 73 (H) 08/19/2010 1407   ALT 78 (H) 08/19/2010 1407   BILITOT 0.5 08/19/2010 1407     Total time spent on today's visit was 55 minutes, including both face-to-face time and nonface-to-face time.  Time included that spent on review of records (prior notes available to me/labs/imaging if pertinent), discussing treatment and goals, answering patient's questions and coordinating care.    Cc:  Pomposini, Rande Brunt, MD

## 2023-06-17 ENCOUNTER — Ambulatory Visit (INDEPENDENT_AMBULATORY_CARE_PROVIDER_SITE_OTHER): Payer: Medicare Other | Admitting: Neurology

## 2023-06-17 VITALS — BP 138/80 | HR 68 | Ht 67.0 in | Wt 184.0 lb

## 2023-06-17 DIAGNOSIS — G250 Essential tremor: Secondary | ICD-10-CM | POA: Diagnosis not present

## 2023-06-17 DIAGNOSIS — Z9689 Presence of other specified functional implants: Secondary | ICD-10-CM | POA: Diagnosis not present

## 2023-06-17 DIAGNOSIS — R202 Paresthesia of skin: Secondary | ICD-10-CM | POA: Diagnosis not present

## 2023-06-17 NOTE — Procedures (Signed)
 Manufacturer of DBS device: [employer] with bluetooth capability  Total time spent programming and in patient education on programming was 10 minutes.  Device was on.    Soft start was confirmed to be on.  Impedences were checked and were within normal limits.  Battery was checked.  Final settings were as follows:    Active Contact Amplitude (mA) PW (ms) Frequency (hz) Side Effects  Left Brain       03/25/20  1-C+ 2.0 90 159 lightheaded   5/6/7(33%each)-C+ 2.0 90 159    8-C+ 2.0 90 159   Program 1 final and active 2/3/4(33%each)-5/6/7(33% each)+ 2.2 90 159   Program 2 final 2/3/4(33% each)-C+ 2.0 60 159   04/05/20 2/3/4(33% each)-5/6/7(33%each)+ 2.4 90 159   06/13/20 2/3/4(33% each)-5/6/7(33%each)+ 2.7 (1.4-3.0) 90 159   06/13/21 2/3/4(33% each)-5/6/7(33%each)+ 2.7 90 159   06/15/22 (2a/b/c)- 33% each(3a/b/c)+33%each 3.0 90 159   06/17/23 (2a/b/c)- 33% each(3a/b/c)+33%each 3.0 90 159                 Right Brain       n/a

## 2024-06-16 ENCOUNTER — Encounter: Admitting: Neurology
